# Patient Record
Sex: Female | Born: 1985
Health system: Southern US, Community
[De-identification: ages and names within clinical notes are randomized; demographics above are authoritative.]

## PROBLEM LIST (undated history)

## (undated) HISTORY — PX: TUBAL LIGATION: SHX77

---
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
## 2006-01-26 ENCOUNTER — Other Ambulatory Visit: Admission: RE | Admit: 2006-01-26 | Discharge: 2006-01-26 | Payer: Self-pay | Admitting: Internal Medicine

## 2007-08-21 ENCOUNTER — Encounter: Admission: RE | Admit: 2007-08-21 | Discharge: 2007-08-21 | Payer: Self-pay | Admitting: Internal Medicine

## 2008-01-10 ENCOUNTER — Other Ambulatory Visit: Admission: RE | Admit: 2008-01-10 | Discharge: 2008-01-10 | Payer: Self-pay | Admitting: Obstetrics and Gynecology

## 2009-02-01 ENCOUNTER — Encounter: Admission: RE | Admit: 2009-02-01 | Discharge: 2009-02-01 | Payer: Self-pay | Admitting: Orthopedic Surgery

## 2009-02-07 ENCOUNTER — Encounter: Admission: RE | Admit: 2009-02-07 | Discharge: 2009-02-07 | Payer: Self-pay | Admitting: Orthopedic Surgery

## 2009-02-21 ENCOUNTER — Encounter: Admission: RE | Admit: 2009-02-21 | Discharge: 2009-02-21 | Payer: Self-pay | Admitting: Orthopedic Surgery

## 2009-03-07 ENCOUNTER — Encounter: Admission: RE | Admit: 2009-03-07 | Discharge: 2009-03-07 | Payer: Self-pay | Admitting: Orthopedic Surgery

## 2009-07-15 ENCOUNTER — Encounter: Admission: RE | Admit: 2009-07-15 | Discharge: 2009-07-15 | Payer: Self-pay | Admitting: Internal Medicine

## 2010-04-22 ENCOUNTER — Ambulatory Visit: Payer: Self-pay | Admitting: Obstetrics & Gynecology

## 2010-04-23 ENCOUNTER — Inpatient Hospital Stay: Payer: Self-pay | Admitting: Obstetrics & Gynecology

## 2010-08-07 NOTE — Discharge Summary (Signed)
[name] [name], [name] [name]                           ACCOUNT NO.:  [id number]   MEDICAL RECORD NO.:  [id number]                   PATIENT TYPE:  INP   LOCATION:  9325                                 FACILITY:  [facility]   PHYSICIAN:  Randye Lobo, M.D.                DATE OF BIRTH:  1985/08/27   DATE OF ADMISSION:  03/04/2002  DATE OF DISCHARGE:  03/08/2002                                 DISCHARGE SUMMARY   FINAL DIAGNOSES:  1. Intrauterine pregnancy at term.  2. Chorioamnionitis.  3. Fetal tachycardia.  4. Nonreassuring fetal heart tones.   PROCEDURE:  Primary low transverse cesarean section.   SURGEON:  Malva Limes, M.D.   COMPLICATIONS:  None.   HOSPITAL COURSE:  This 25 year old G1 P0 presents at [redacted] weeks gestation in  active labor.  The patient's antepartum course had been complicated by late  prenatal care; she was not seen in our office until about 24-[redacted] weeks  gestation.  Otherwise, the patient's antepartum course had been  uncomplicated.  She did have a negative group B strep culture performed in  the office.  She was admitted at this time and Pitocin was started for labor  augmentation.  The patient's cervix was about 3 cm, 80% effaced, at a -3  station upon admission.  The patient had a very protracted labor curve but  continued to have change.  IUPCs were placed.  Later in the day on March 06, 2003 Dr. Dareen Piano was called.  The patient was noted to have deep  variable decelerations and fetal tachycardia and possible chorioamnionitis.  The patient's cervix at this time was completely dilated, rim present, and 0  station.  At this point a vaginal delivery was not eminent and she needed to  proceed with a cesarean section.  She was given Tylenol and subcu  terbutaline at that time and was taken to the operating room by Dr. Malva Limes where a primary low transverse cesarean section was performed with  the delivery of a  7 pound 10 ounce female infant with Apgars of  8 and 9.  The delivery went  without complications.  The patient's postoperative course was benign with  initial low-grade fevers.  The patient was felt ready for discharge on  postoperative day #3.   DISPOSITION:  1. Sent home on a regular diet.  2. Told to decrease activities.  3. Told to continue prenatal vitamins and iron sulfate.  4. Given a prescription for Tylox one to two q.4h. as needed for pain.  5. Told to follow up in the office in four weeks.   NOTE:  The baby did have bilateral dislocated hips.    LABORATORY DATA:  The patient's postoperative labs included a hemoglobin of  8.4 and a white blood cell count of 15.0.     Leilani Able, P.A.-C.  Randye Lobo, M.D.    MB/MEDQ  D:  05/15/2002  T:  05/15/2002  Job:  [id number]

## 2010-08-07 NOTE — Op Note (Signed)
[name], [name] [name]                           ACCOUNT NO.:  [id number]   MEDICAL RECORD NO.:  [id number]                   PATIENT TYPE:  INP   LOCATION:  9325                                 FACILITY:  [facility]   PHYSICIAN:  Malva Limes, M.D.                 DATE OF BIRTH:  1985/08/18   DATE OF PROCEDURE:  03/06/2002  DATE OF DISCHARGE:                                 OPERATIVE REPORT   PREOPERATIVE DIAGNOSES:  1. Intrauterine pregnancy at term.  2. Chorioamnionitis.  3. Fetal tachycardia.  4. Nonreassuring fetal heart tones.   POSTOPERATIVE DIAGNOSES:  1. Intrauterine pregnancy at term.  2. Chorioamnionitis.  3. Fetal tachycardia.  4. Nonreassuring fetal heart tones.   PROCEDURE:  Primary low transverse cesarean section.   SURGEON:  Malva Limes, M.D.   ANESTHESIA:  Epidural.   ANTIBIOTICS:  Cefotan 1 g.   ESTIMATED BLOOD LOSS:  900 cc.   COMPLICATIONS:  None.   SPECIMENS:  Placenta to pathology.   FINDINGS:  The patient had normal-appearing fallopian tubes and ovaries  bilaterally.  The uterus appeared to be normal.  The patient delivered one  live viable white female infant with Apgars of 8 at one minute and 9 at five  minutes, weighing 7 pounds 10 ounces.   DESCRIPTION OF PROCEDURE:  The patient was taken to the operating room,  where her epidural anesthetic was reinjected.  Once an adequate level was  reached, the patient was prepped with Betadine and draped in the usual  fashion for this procedure.  A Pfannenstiel incision was made.  This was  carried down to fascia.  The fascia was entered in the midline and extended  laterally with a Mayo scissors.  The rectus muscles were separated from the  fascia with the Bovie.  The rectus muscles were divided in the midline and  taken superiorly and inferiorly.  The parietal peritoneum was entered  sharply and taken superiorly and inferiorly, and the bladder flap was taken  down sharply.  A low transverse uterine  incision was made in the midline and  extended laterally with blunt dissection.  Amniotic fluid was noted to be  clear on entering the uterine cavity.  The infant was delivered in the  vertex presentation.  On delivery of the head, the oropharynx and nostrils  were bulb-suctioned.  The remaining infant was then delivered.  The cord was  clamped and cut and the infant handed to the waiting NICU team.  Cord blood  was then obtained.  The placenta was then manually removed and handed off to  be sent to pathology.  The uterus was exteriorized.  The uterine cavity was  wiped with a wet lap.  The uterine incision was closed in a single layer of  0 chromic in a running locking fashion.  The bladder flap was closed using 3-  0 chromic in a running fashion.  The uterus was placed back into the  abdominal cavity.  Hemostasis was checked and felt to be adequate.  The  abdominal gutters were wiped with a wet lap.  The parietal peritoneum and  rectus muscles were reapproximated in the midline using 3-0 chromic in a  running fashion.  The fascia was closed using 0 Monocryl in a running  fashion.  The subcuticular tissue was closed using interrupted 2-0 plain gut  suture.  The skin was closed using 4-0 Vicryl in a subcuticular fashion.  The patient tolerated the procedure well.  She was taken to the recovery  room in stable condition.  Instrument and lap counts were correct x2.                                               Malva Limes, M.D.    MA/MEDQ  D:  03/06/2002  T:  03/06/2002  Job:  [id number]

## 2012-04-04 IMAGING — US US TRANSVAGINAL NON-OB
1 series · 14 of 25 positions shown · non-contrast
Comparison: None.

CLINICAL DATA: menorrhagia

TRANSABDOMINAL AND TRANSVAGINAL ULTRASOUND OF PELVIS
TECHNIQUE: Both transabdominal and transvaginal ultrasound
examinations of the pelvis were performed including evaluation of
the uterus, ovaries, adnexal regions, and pelvic cul-de-sac.

[Series 1: us transvaginal non-ob · 0.28mm/px · 14 of 60 slices shown]
[im 1/60]
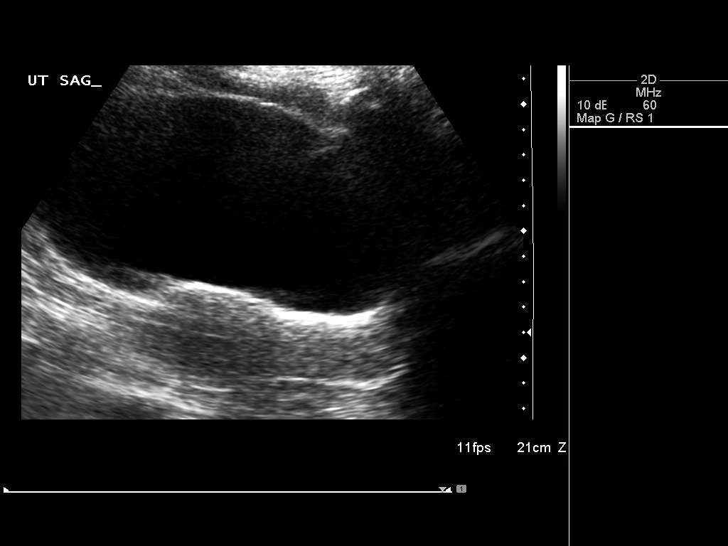
[im 5/60]
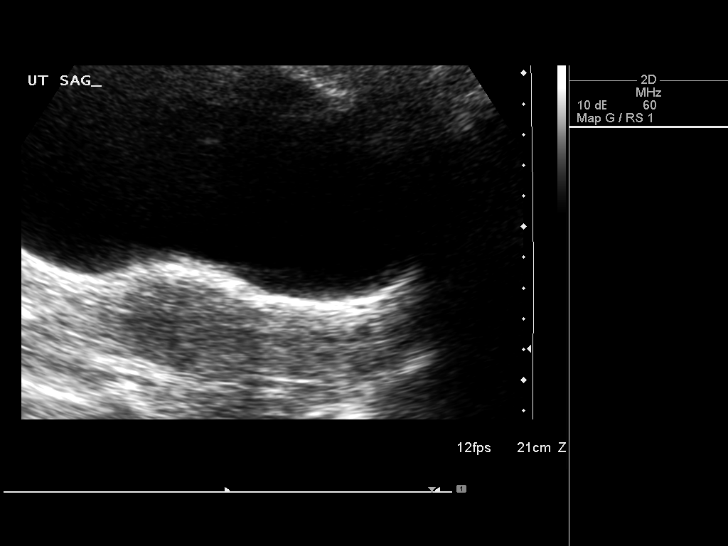
[im 10/60]
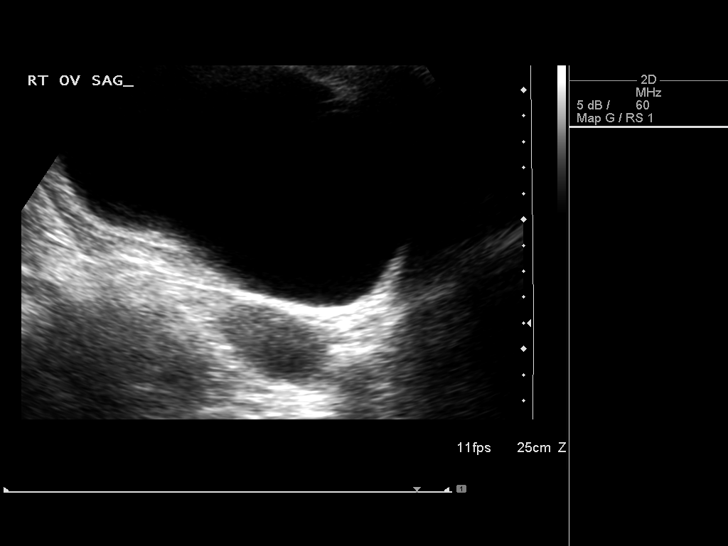
[im 15/60]
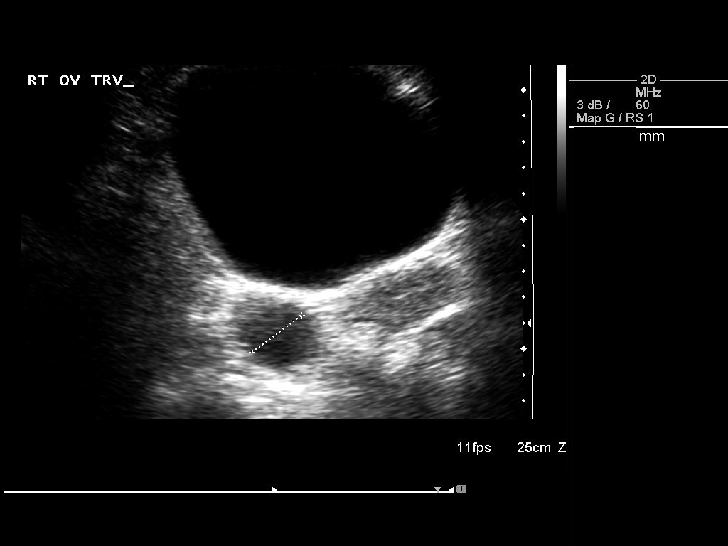
[im 20/60]
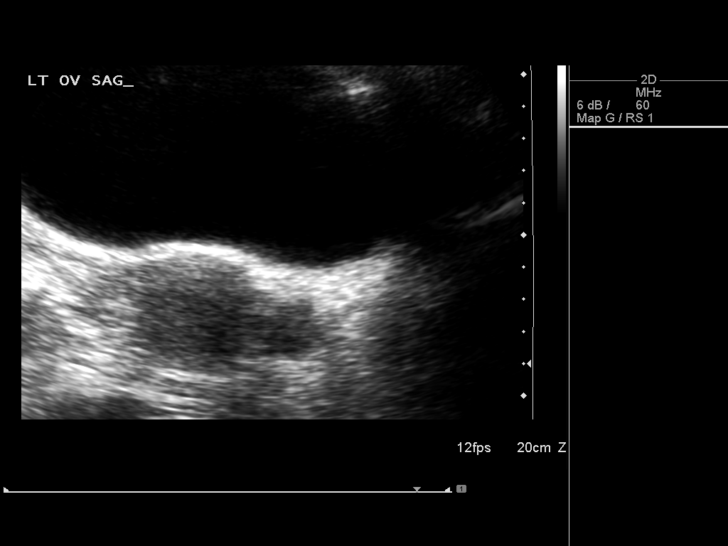
[im 23/60]
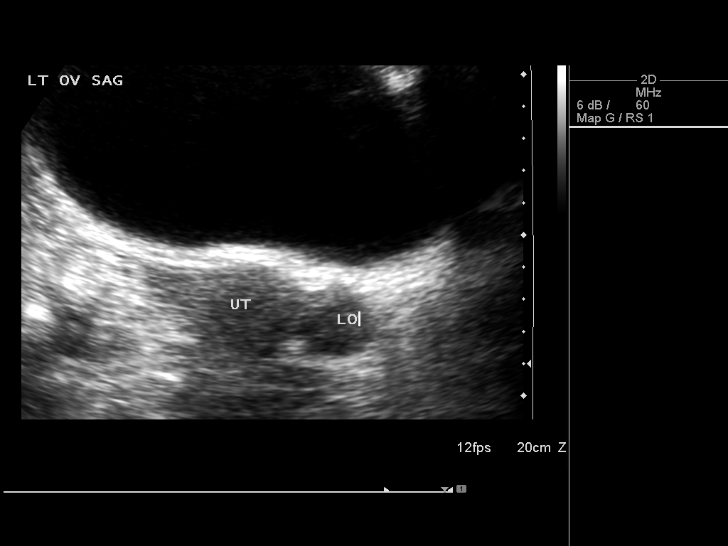
[im 28/60]
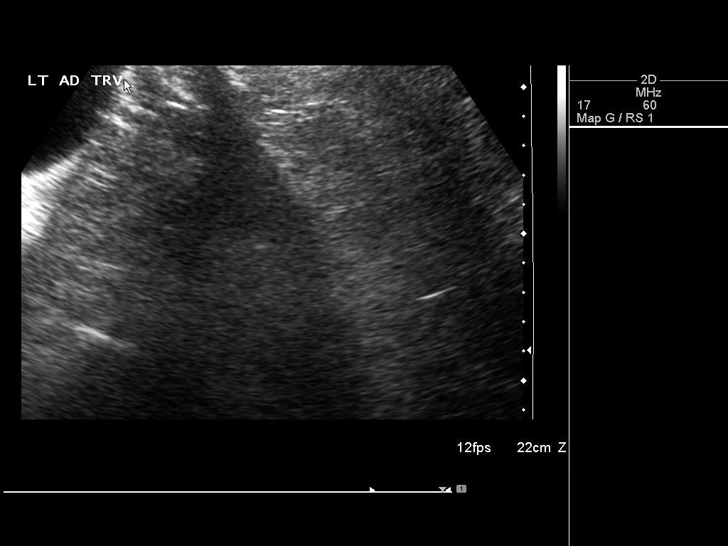
[im 32/60]
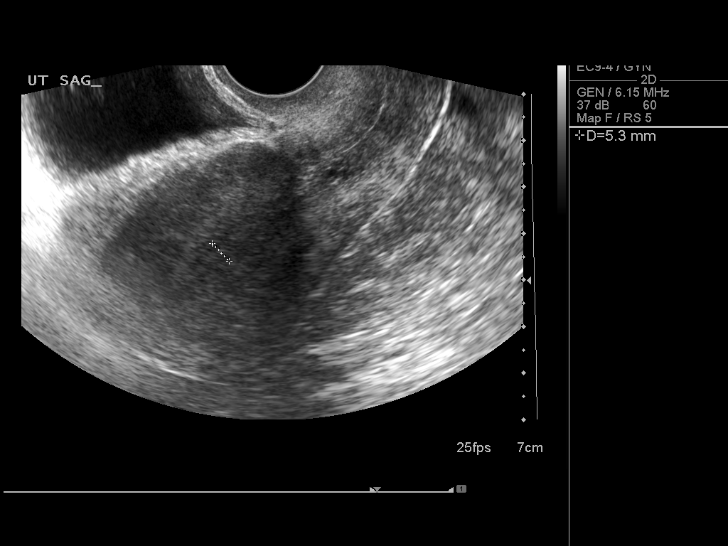
[im 37/60]
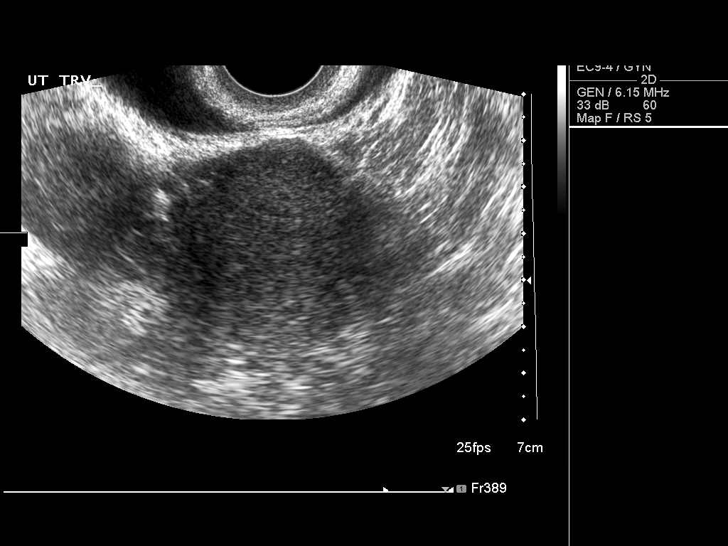
[im 40/60]
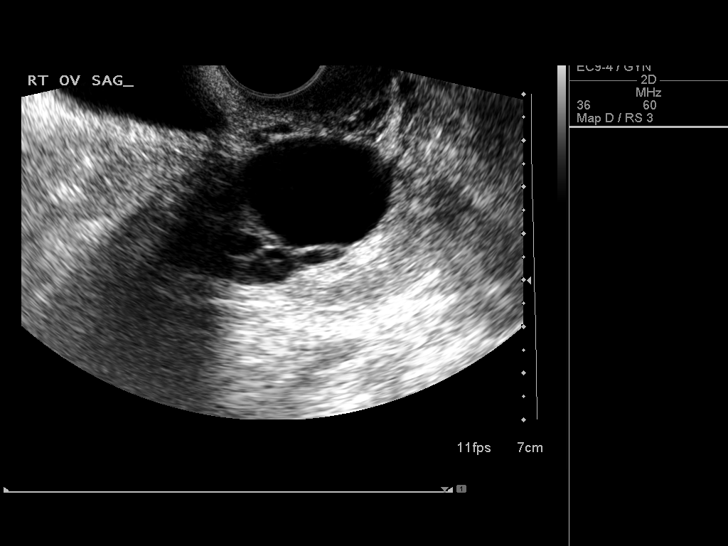
[im 45/60]
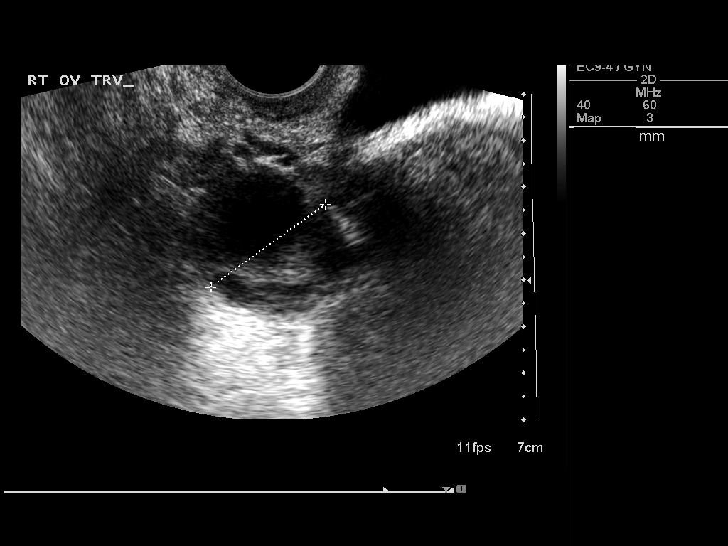
[im 50/60]
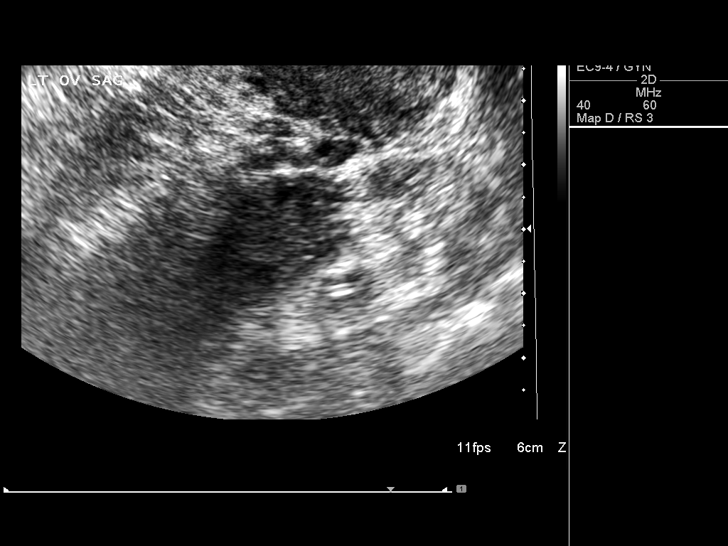
[im 55/60]
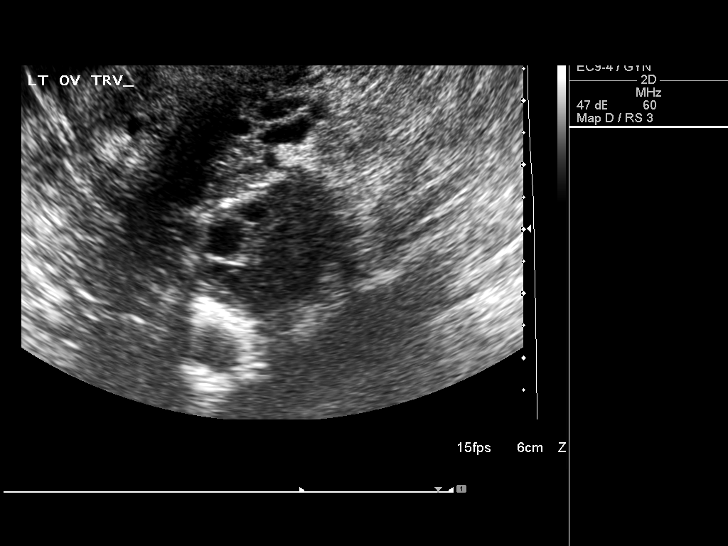
[im 60/60]
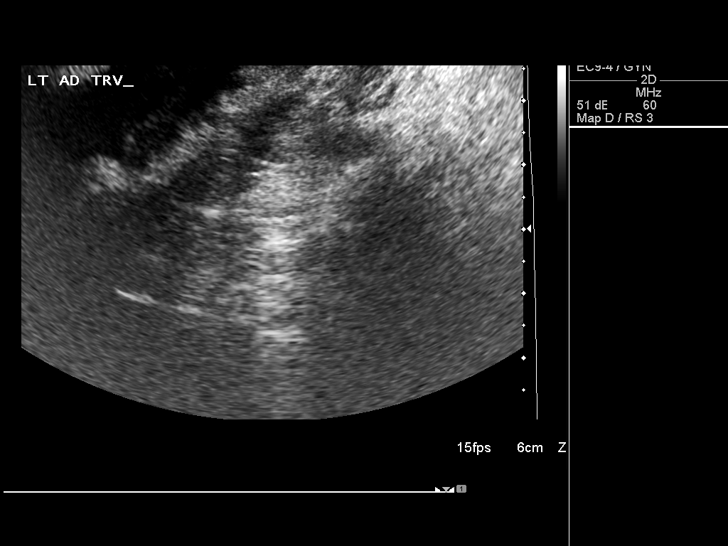

[14 of 25 positions shown; findings below may reference images not displayed]

FINDINGS: The uterus is normal in size and echotexture.  The
uterine dimensions are 7.8 x 4.0 x 3.7 cm.  The endometrial stripe
is thin and homogeneous, measuring 5 mm in width.

 Both ovaries are normal in size and appearance.  The right ovary
measures 4.9 x 2.9 x 3.0 cm and contains a 3 cm simple cyst which
is within normal limits for patient this age.  The left ovary
measures 2.8 x 1.7 x 1.9 cm.  There are no adnexal masses or free
pelvic fluid.
IMPRESSION: Normal pelvic ultrasound.

## 2013-06-21 ENCOUNTER — Observation Stay: Payer: Self-pay

## 2013-06-21 LAB — PROTEIN / CREATININE RATIO, URINE
Creatinine, Urine: 67.5 mg/dL (ref 30.0–125.0)
PROTEIN/CREAT. RATIO: 74 mg/g{creat} (ref 0–200)
Protein, Random Urine: 5 mg/dL (ref 0–12)

## 2013-06-21 LAB — PIH PROFILE
AST: 13 U/L — AB (ref 15–37)
Anion Gap: 7 (ref 7–16)
BUN: 5 mg/dL — AB (ref 7–18)
CO2: 26 mmol/L (ref 21–32)
Calcium, Total: 8.4 mg/dL — ABNORMAL LOW (ref 8.5–10.1)
Chloride: 105 mmol/L (ref 98–107)
Creatinine: 0.43 mg/dL — ABNORMAL LOW (ref 0.60–1.30)
EGFR (African American): >60
EGFR (Non-African Amer.): >60
GLUCOSE: 73 mg/dL (ref 65–99)
HCT: 33.9 % — ABNORMAL LOW (ref 35.0–47.0)
HGB: 11.3 g/dL — ABNORMAL LOW (ref 12.0–16.0)
MCH: 29.7 pg (ref 26.0–34.0)
MCHC: 33.3 g/dL (ref 32.0–36.0)
MCV: 89 fL (ref 80–100)
OSMOLALITY: 272 (ref 275–301)
PLATELETS: 225 10*3/uL (ref 150–440)
Potassium: 3.3 mmol/L — ABNORMAL LOW (ref 3.5–5.1)
RBC: 3.79 10*6/uL — AB (ref 3.80–5.20)
RDW: 13.6 % (ref 11.5–14.5)
Sodium: 138 mmol/L (ref 136–145)
Uric Acid: 3.7 mg/dL (ref 2.6–6.0)
WBC: 11.6 10*3/uL — AB (ref 3.6–11.0)

## 2013-08-20 ENCOUNTER — Ambulatory Visit: Payer: Self-pay | Admitting: Obstetrics and Gynecology

## 2013-08-20 LAB — CBC WITH DIFFERENTIAL/PLATELET
BASOS PCT: 0.3 %
Basophil #: 0 10*3/uL (ref 0.0–0.1)
EOS ABS: 0.1 10*3/uL (ref 0.0–0.7)
EOS PCT: 0.5 %
HCT: 34.4 % — AB (ref 35.0–47.0)
HGB: 11.3 g/dL — ABNORMAL LOW (ref 12.0–16.0)
LYMPHS ABS: 1.6 10*3/uL (ref 1.0–3.6)
LYMPHS PCT: 14 %
MCH: 29.4 pg (ref 26.0–34.0)
MCHC: 32.8 g/dL (ref 32.0–36.0)
MCV: 89 fL (ref 80–100)
MONOS PCT: 9.3 %
Monocyte #: 1.1 x10 3/mm — ABNORMAL HIGH (ref 0.2–0.9)
NEUTROS PCT: 75.9 %
Neutrophil #: 8.7 10*3/uL — ABNORMAL HIGH (ref 1.4–6.5)
PLATELETS: 234 10*3/uL (ref 150–440)
RBC: 3.84 10*6/uL (ref 3.80–5.20)
RDW: 14.4 % (ref 11.5–14.5)
WBC: 11.5 10*3/uL — ABNORMAL HIGH (ref 3.6–11.0)

## 2013-08-21 ENCOUNTER — Inpatient Hospital Stay: Payer: Self-pay

## 2013-08-22 LAB — HEMATOCRIT: HCT: 32 % — AB (ref 35.0–47.0)

## 2013-08-23 LAB — PATHOLOGY REPORT

## 2013-08-28 ENCOUNTER — Ambulatory Visit: Payer: Self-pay

## 2013-11-01 ENCOUNTER — Ambulatory Visit (INDEPENDENT_AMBULATORY_CARE_PROVIDER_SITE_OTHER): Payer: 59 | Admitting: Podiatry

## 2013-11-01 ENCOUNTER — Ambulatory Visit (INDEPENDENT_AMBULATORY_CARE_PROVIDER_SITE_OTHER): Payer: 59

## 2013-11-01 ENCOUNTER — Encounter: Payer: Self-pay | Admitting: Podiatry

## 2013-11-01 VITALS — BP 132/79 | HR 85 | Resp 16 | Ht 64.0 in | Wt 227.0 lb

## 2013-11-01 DIAGNOSIS — M722 Plantar fascial fibromatosis: Secondary | ICD-10-CM

## 2013-11-01 MED ORDER — TRIAMCINOLONE ACETONIDE 10 MG/ML IJ SUSP
10.0000 mg | Freq: Once | INTRAMUSCULAR | Status: AC
  Administered 2013-11-01: 10 mg

## 2013-11-01 NOTE — Progress Notes (Signed)
   Subjective:    Patient ID: [name] [name], female    DOB: 07-Sep-1985, 28 y.o.   MRN: [id number]  HPI Comments: "I have pain in this foot"  Patient c/o aching arch and medial foot right since early July. The area is swollen, red and palpable nodules. She tried soaking-some help. Walking a lot makes worse.  Foot Pain      Review of Systems  All other systems reviewed and are negative.      Objective:   Physical Exam        Assessment & Plan:

## 2013-11-01 NOTE — Progress Notes (Signed)
Subjective:     Patient ID: [name] [name], female   DOB: 11-Nov-1985, 28 y.o.   MRN: [id number]  Foot Pain   patient presents with pain in the arch of the right foot stating that she had baby 2 months ago and it's been hurting since then and she does not remember any injury   Review of Systems  All other systems reviewed and are negative.      Objective:   Physical Exam  Nursing note and vitals reviewed. Constitutional: She is oriented to person, place, and time.  Cardiovascular: Intact distal pulses.   Musculoskeletal: Normal range of motion.  Neurological: She is oriented to person, place, and time.  Skin: Skin is warm.   neurovascular status intact with muscle strength adequate and range of motion of the subtalar and midtarsal joint within normal limits. Patient is found to have pain in the right arch distal on the medial side with inflammation and no indication of a bite that occurred based on evaluation. Digits are well-perfused and arch height is mildly diminished     Assessment:     Probable medial band fasciitis versus any kind of bite or other systemic problem    Plan:     H&P and x-ray reviewed and injected carefully around the medial band 3 mg Kenalog 5 mg I can Marcaine mixture and advised on anti-inflammatories reduced activity and to reappoint if symptoms persist

## 2013-11-05 ENCOUNTER — Ambulatory Visit: Payer: Self-pay | Admitting: Podiatry

## 2014-07-13 NOTE — Op Note (Signed)
PATIENT NAME:  [name], [name] MR#:  [id number] DATE OF BIRTH:  1985/11/20  DATE OF PROCEDURE:  08/21/2013  PREOPERATIVE DIAGNOSES:  1. History of two prior low transverse cesarean section.  2. Desires permanent sterility.  3. Intrauterine pregnancy at 39 weeks, 1 day.   POSTOPERATIVE DIAGNOSES:  1. History of two prior low transverse cesarean section.  2. Desires permanent sterility.  3. Intrauterine pregnancy at 39 weeks, 1 day.   PROCEDURE PERFORMED: Repeat low transverse cesarean section via Pfannenstiel skin incision and bilateral tubal ligation via Pomeroy method.   ANESTHESIA USED: Spinal.   PRIMARY SURGEON: Florina Ou. Bonney Aid, M.D.   ASSISTANT: Dr. Annamarie Major.   ESTIMATED BLOOD LOSS: 800 mL   OPERATIVE FLUIDS: 1300 mL of crystalloid.   URINE OUTPUT: 200 mL of crystalloid.   PREOPERATIVE ANTIBIOTICS: 3 grams Ancef.   DRAINS OR TUBES: Foley to gravity drainage, On-Q catheter system.   IMPLANTS: None.   COMPLICATIONS: None.   SPECIMENS REMOVED: Portion of right and left tube.   INTRAOPERATIVE FINDINGS: Normal tubes, ovaries, and uterus. No adhesive disease noted within the abdomen. There was minimal scarring of the rectus fascia. Delivery resulted in the birth of a liveborn female infant weighing 3670 grams, 8 pounds 1 ounce, Apgars 8 and 9.   THE PATIENT CONDITION FOLLOWING PROCEDURE: Stable.   PROCEDURE IN DETAIL: Risks, benefits, and alternatives of the procedure were discussed with the patient prior to proceeding to the Operating Room. The patient was taken to the Operating Room where she was placed under spinal anesthesia. The patient was positioned in the supine position, prepped and draped in the usual fashion. Prior to proceeding with the case a timeout procedure was performed and the level anesthetic was checked and noted to be adequate. A Pfannenstiel skin incision was made 2 cm above the pubic symphysis utilizing the patient's pre-existing scar. This was  carried down sharply to the level of the rectus fascia using the scalpel. The fascia was incised in the midline with the scalpel before being extended using Mayo scissors. The superior border of the rectus fascia was then grasped with two Coker clamps. The underlying rectus muscles were dissected off the rectus fascia bluntly. The median raphe incised using Mayo scissors. The inferior border of the rectus fascia was dissected off the rectus muscle in a similar fashion. The midline was identified and there was a small defect in the peritoneum already, which was grasped and extended to allow entry into the peritoneal cavity. A bladder blade was then placed. A bladder flap was created using Metzenbaum scissors further developed digitally before replacing the bladder blade and displacing the bladder caudally. A low transverse uterine incision was then scored on the uterus. The uterus was entered bluntly using the operator's finger. The hysterotomy incision was extended using manual traction. Placing the operator's hand into the hysterotomy incision, the infant was noted to be in OA position. The vertex was grasped, flexed, brought to the incision, delivered atraumatically using fundal pressure. The remainder of the body delivered with ease. The infant was suctioned, cord was clamped and cut and the infant was passed to the awaiting pediatricians. Placenta was delivered using manual extraction. The uterus was exteriorized and wiped clean of clots and debris and closed in a single layer closure of 0 Vicryl in a running locked fashion. Attention was then turned to the patient's right fallopian tube. This was grasped with a Babcock clamp in the mid isthmic portion and doubly suture ligated using a 0  Vicryl wheel. The intervening knuckle of tube was then excised using Metzenbaum scissors. This was also repeated on the patient's left tube in a similar fashion. Both excisions revealed complete cross sections of tube and the  tubal knuckles were noted to be hemostatic. The uterus was returned to the abdomen, and in the process of returning the uterus to the abdomen and reinspecting the tubes, the proximal portion of the right tubal knuckle was noted to have slipped out of the sutures. This was oversewn using a 2-0 Vicryl across the tubal knuckle and a running locked stitch of 2-0 Vicryl across the mesosalpinx. The tube was noted to be hemostatic. Following this application of suture, the left tube was noted to be well within the knuckle of tube and hemostatic. The hysterotomy incision was reinspected and noted to be hemostatic as well. The peritoneal gutters were wiped clean of clots and debris. The rectus muscles were reapproximated in midline using a 2-0 Vicryl mattress stitch. The On-Q catheters were then placed subfascially per the usual protocol. The fascia was closed using a single looped #1 PDS in a running fashion. The subcutaneous tissue was irrigated. Hemostasis was achieved using the Bovie, a 53 plain gut needle was then used to do a running suture to reapproximate the subcutaneous dead space. Skin was closed using a 4-0 Monocryl in a subcuticular fashion. The incision was then dressed with Dermabond as were the On-Q catheters, which were bolused with 5 mL of 0.5% bupivacaine each. Sponge, needle and instrument counts were correct x 2. The patient tolerated the procedure well and was taken to the recovery room in stable condition.    ____________________________ Florina OuAndreas M. Bonney AidStaebler, MD ams:sg D: 08/22/2013 08:46:42 ET T: 08/22/2013 09:24:16 ET JOB#: [id number]  cc: Florina OuAndreas M. Bonney AidStaebler, MD, <Dictator> Carmel SacramentoANDREAS Cathrine MusterM Ashleigh Luckow MD ELECTRONICALLY SIGNED 08/30/2013 8:30

## 2014-07-13 NOTE — Consult Note (Signed)
Details:   - Established Intermediate visit charge. 08/28/2013- Baby is [name] [name]. Born [birth year] at 8lb 1 oz at birth ; 7lb 11oz yesterday at MD office: 7lb 13oz nude today. Mom c/o poor latch after milk "came in" around day 4-5 of life. Ended up pumping and bottle feeding a little and for the last 24 hours , has been using 24 mm nipple shield exclusively. >6 wets adn 5 stools last 24 hours. Mildly jaundiced in face only (bili level done yesterday and is at 11. No orders from MD other than keep breastfeeding). Nipples are large but intact. Breasts are quite full, but not engorged. She brought in a 7oz bottle of pumped milk that was from one session and both breasts from earlier in the day. [name] [name] has her 633 yo daughter and her supportive mother with her. [name] [name] is calm and eager to have breastfeeding go well.  Feeding: Mom was not going to use pillow support (potential cause of latch issue #1); had [name] [name] placed on her back with head turned toward breast (#2); Had her own fingers too close to nipple for proper latch (#3); and [name] [name] was used to fast flow of bottle and shape of nipple shield (#4+5). We addressed all these issues. [name] [name] still fussed at breast at first wihtout using nipple shield, so I had [name] [name] pump to start flow and soften areola a bit more. [name] [name] still fussed, so Mom applied nipple shield to get her started. She needed to massage breast to help with th eflow, once [name] [name] got the hang of it, she had rhythmic suck/swallow with a few gulps which she handles well by taking a pause before resuming. After a couple minutes of that, I had Mom remove the shield an dtry to latch [name] [name] on (I think reclined hold will be best for them; it was in hospital, but I could not fit recliner in my office. Mom to try at home). [name] [name] soon was doing same suck swallow pattern as with the shield. I stressed importance and differeance of nursing without shield as she is quite able. She took in 61 ml in approx 15  minutes.   - I gave report [place] [phone]  PLAN:* feed per cues at least 8-12 /24 hours to soften one breast quite well (She is making over 4 oz per breast right now) *Pre-pump/hand express to soften areola only if it is too firm for proper latch or to start flow of milk *Careful position/latch technique and hand placement on breast- try reclined hold *Lots of skin to skin *Call if more help needed. [facility] or [facility] (ph #s given)   Electronic Signatures: [name] [name], [name] [name] (RN, BSN, [employer]CLC)  (Signed 09-Jun-15 16:15)  Authored: Details   Last Updated: 09-Jun-15 16:15 by [name] [name], [name] [name] (RN, BSN, IBCLC)

## 2014-07-30 NOTE — H&P (Signed)
L&D Evaluation:  History:  HPI Pt is a 29 yo G3P2 at 30.[redacted] weeks GA with an EDC of 08/27/13 who presents to L&D after being sent over from the office with elevated BP. She also had a growth scan today which was normal. She reports + FM, denies vb, lof or ctx. She denies visual disturbances, RUQ pain, or neurological symptoms. Her prenatal course is significant for obesity, and elevated BP at 17 weeks. A baseline PIH panel was done and was normal. Baseline 24 hour urine was 120mg . Prenatal labs: A+, RI, VI, and TDaP and flu vaccine in this pregnancy.   Presents with other, PIH work up   Patient's Medical History No Chronic Illness   Patient's Surgical History Previous C-Section  c/s x2   Medications Pre [place]atal Vitamins   Allergies NKDA   Social History none   Family History Non-Contributory   ROS:  ROS All systems were reviewed.  HEENT, CNS, GI, GU, Respiratory, CV, Renal and Musculoskeletal systems were found to be normal.   Exam:  Vital Signs stable   General no apparent distress   Mental Status clear   Abdomen gravid, non-tender   Mebranes Intact   FHT normal rate with no decels, 140-150's.   Ucx absent   Skin dry, no lesions, no rashes   Lymph no lymphadenopathy   Impression:  Impression evaluation for PIH   Plan:  Plan PIH panel, plan of care to be determined by results of PIH panel   Follow Up Appointment already scheduled   Electronic Signatures for Addendum Section:  Conard NovakJackson, Stephen D (MD) (Signed Addendum 02-Apr-15 14:35)  Patient's HELLP and preeclampsia labs have returned as normal.  Spot Urine prot/creatinie ratio = 74. BPs 130/74, 125/76, 119/72, 123/68, 127/69, 124/67 NST reassuring given gestational age with multiple 10x10 accels in a 20 minute period. Patient still without features of severe preeclampsia (no headache visual changes, and ruq pain). Will discharge home with plan for follow up in 1 week.  Strict precautions given (headache, visual  changes, ruq pain, decrease in fetal movement, contractions, vaginal bleeding. leakage of fluid, other concerning vague symptoms).  She voices understanding.   Electronic Signatures: Jannet MantisSubudhi, Kristian Hazzard (CNM)  (Signed 02-Apr-15 12:36)  Authored: L&D Evaluation   Last Updated: 02-Apr-15 14:35 by Conard NovakJackson, Stephen D (MD)

## 2016-01-22 ENCOUNTER — Ambulatory Visit (INDEPENDENT_AMBULATORY_CARE_PROVIDER_SITE_OTHER): Payer: 59 | Admitting: Family Medicine

## 2016-01-22 ENCOUNTER — Encounter: Payer: Self-pay | Admitting: Family Medicine

## 2016-01-22 DIAGNOSIS — D509 Iron deficiency anemia, unspecified: Secondary | ICD-10-CM | POA: Insufficient documentation

## 2016-01-22 DIAGNOSIS — J3089 Other allergic rhinitis: Secondary | ICD-10-CM

## 2016-01-22 DIAGNOSIS — F418 Other specified anxiety disorders: Secondary | ICD-10-CM | POA: Insufficient documentation

## 2016-01-22 DIAGNOSIS — Z6836 Body mass index (BMI) 36.0-36.9, adult: Secondary | ICD-10-CM

## 2016-01-22 DIAGNOSIS — D508 Other iron deficiency anemias: Secondary | ICD-10-CM | POA: Diagnosis not present

## 2016-01-22 DIAGNOSIS — Z8639 Personal history of other endocrine, nutritional and metabolic disease: Secondary | ICD-10-CM | POA: Insufficient documentation

## 2016-01-22 DIAGNOSIS — F4323 Adjustment disorder with mixed anxiety and depressed mood: Secondary | ICD-10-CM | POA: Diagnosis not present

## 2016-01-22 LAB — CBC WITH DIFFERENTIAL/PLATELET
BASOS PCT: 0 %
Basophils Absolute: 0 cells/uL (ref 0–200)
EOS ABS: 65 {cells}/uL (ref 15–500)
Eosinophils Relative: 1 %
HEMATOCRIT: 36.9 % (ref 35.0–45.0)
HEMOGLOBIN: 12 g/dL (ref 11.7–15.5)
LYMPHS ABS: 2275 {cells}/uL (ref 850–3900)
Lymphocytes Relative: 35 %
MCH: 28.9 pg (ref 27.0–33.0)
MCHC: 32.5 g/dL (ref 32.0–36.0)
MCV: 88.9 fL (ref 80.0–100.0)
MONO ABS: 585 {cells}/uL (ref 200–950)
MPV: 9.7 fL (ref 7.5–12.5)
Monocytes Relative: 9 %
NEUTROS ABS: 3575 {cells}/uL (ref 1500–7800)
Neutrophils Relative %: 55 %
Platelets: 309 10*3/uL (ref 140–400)
RBC: 4.15 MIL/uL (ref 3.80–5.10)
RDW: 14.6 % (ref 11.0–15.0)
WBC: 6.5 10*3/uL (ref 3.8–10.8)

## 2016-01-22 MED ORDER — ESCITALOPRAM OXALATE 10 MG PO TABS: 10.0000 mg | ORAL_TABLET | Freq: Every day | ORAL | 1 refills | Status: DC

## 2016-01-22 NOTE — Assessment & Plan Note (Signed)
Per patient name years ago- was told by her GYN. -We'll obtain fasting lipid profile today.

## 2016-01-22 NOTE — Assessment & Plan Note (Signed)
Briefly discussed possibility of going on a beta blocker with patient prior to being put in a stressful situation.

## 2016-01-22 NOTE — Assessment & Plan Note (Signed)

## 2016-01-22 NOTE — Assessment & Plan Note (Signed)
Takes a multivitamin daily with extra iron  - We will check CBC

## 2016-01-22 NOTE — Progress Notes (Signed)
New patient office visit note:  Impression and Recommendations:    1. Other iron deficiency anemia   2. Adjustment disorder with mixed anxiety and depressed mood   3. Environmental and seasonal allergies   4. Situational anxiety   5. History of hyperlipidemia   6. BMI 36.0-36.9,adult    Iron deficiency anemia Takes a multivitamin daily with extra iron  - We will check CBC  Adjustment disorder with mixed anxiety >er depressed mood Been on Lexapro for years now. Stable. Refill given.  - Xanax only occasionally as needed- once or twice per month is ok  Environmental and seasonal allergies -Claritin daily -Over-the-counter Flonase or Rhinocort when necessary -Sinus rinses twice a day  Situational anxiety Briefly discussed possibility of going on a beta blocker with patient prior to being put in a stressful situation.  History of hyperlipidemia Per patient name years ago- was told by her GYN. -We'll obtain fasting lipid profile today.  BMI 36.0-36.9,adult Explained to patient what BMI refers to, and what it means medically.    Told patient to think about it as a "medical risk stratification measurement" and how increasing BMI is associated with increasing risk/ or worsening state of various diseases such as hypertension, hyperlipidemia, diabetes, premature OA, depression etc.  American Heart Association guidelines for healthy diet, basically Mediterranean diet, and exercise guidelines of 30 minutes 5 days per week or more discussed in detail.  Health counseling performed.  All questions answered.     Orders Placed This Encounter  Procedures  . CBC with Differential/Platelet  . COMPLETE METABOLIC PANEL WITH GFR  . Hemoglobin A1c  . Lipid Panel w/reflex Direct LDL  . T4, free  . TSH  . VITAMIN D 25 Hydroxy (Vit-D Deficiency, Fractures)    New Prescriptions   No medications on file    Modified Medications   Modified Medication Previous Medication   ESCITALOPRAM (LEXAPRO) 10 MG TABLET escitalopram (LEXAPRO) 10 MG tablet      Take 1 tablet (10 mg total) by mouth daily.    Take 10 mg by mouth daily.    Discontinued Medications   DOCOSAHEXAENOIC ACID (DHA PO)    Take by mouth.   IRON PO    Take by mouth.   PRENATAL VIT-FE FUMARATE-FA (PRENATAL VITAMIN PO)    Take by mouth.    Return for f/up to review FBW at your convenience and discuss wt loss.  The patient was counseled, risk factors were discussed, anticipatory guidance given.  Gross side effects, risk and benefits, and alternatives of medications discussed with patient.  Patient is aware that all medications have potential side effects and we are unable to predict every side effect or drug-drug interaction that may occur.  Expresses verbal understanding and consents to current therapy plan and treatment regimen.  Please see AVS handed out to patient at the end of our visit for further patient instructions/ counseling done pertaining to today's office visit.    Note: This document was prepared using Dragon voice recognition software and may include unintentional dictation errors.  ----------------------------------------------------------------------------------------------------------------------    Subjective:    Chief Complaint  Patient presents with  . Establish Care    HPI: [name] [name] is a pleasant 30 y.o. female who [facility] to review their medical history with me and establish care.   I asked the patient to review their chronic problem list with me to ensure everything was  updated and accurate.    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   Wt- w/in past yr-- was 34 in Jan-- was 236 prior.  "walk away the lbs DVD at home".   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      Patient Care Team    Relationship Specialty Notifications Start End  Thomasene Lot, DO PCP - General Family Medicine  01/22/16   Lunette Stands, MD Consulting Physician Orthopedic Surgery  01/22/16   Vena Austria, MD Referring Physician Obstetrics and Gynecology  01/22/16   Blima Ledger, OD  Optometry  01/22/16      Wt Readings from Last 3 Encounters:  01/22/16 215 lb 8 oz (97.8 kg)  11/01/13 227 lb (103 kg)   BP Readings from Last 3 Encounters:  01/22/16 135/85  11/01/13 132/79   Pulse Readings from Last 3 Encounters:  01/22/16 83  11/01/13 85   BMI Readings from Last 3 Encounters:  01/22/16 36.99 kg/m  11/01/13 38.96 kg/m   No results found for: HGBA1C  Patient Active Problem List   Diagnosis Date Noted  . Iron deficiency anemia 01/22/2016  . Adjustment disorder with mixed anxiety >er depressed mood 01/22/2016  . Environmental and seasonal allergies 01/22/2016  . Situational anxiety 01/22/2016  . History of hyperlipidemia 01/22/2016  . BMI 36.0-36.9,adult 01/22/2016     History reviewed. No pertinent past medical history.   Past Surgical History:  Procedure Laterality Date  . CESAREAN SECTION    . TUBAL LIGATION       Family History  Problem Relation Age of Onset  . Cancer Maternal Grandmother 56    ovarian  . Diabetes Maternal Grandmother 70     History  Drug Use No    History  Alcohol Use No    History  Smoking Status  . Never Smoker  Smokeless Tobacco  . Never Used    Patient's Medications  New Prescriptions   No medications on file  Previous Medications   CLONAZEPAM (KLONOPIN) 0.5 MG TABLET    Take 0.5 mg by mouth as needed for anxiety.   LORATADINE (CLARITIN) 10 MG TABLET    Take 10 mg by mouth daily.   MULTIPLE VITAMIN (MULTIVITAMIN) TABLET    Take 1 tablet by mouth daily.  Modified Medications   Modified Medication Previous Medication   ESCITALOPRAM (LEXAPRO) 10 MG TABLET  escitalopram (LEXAPRO) 10 MG tablet      Take 1 tablet (10 mg total) by mouth daily.    Take 10 mg by mouth daily.  Discontinued Medications   DOCOSAHEXAENOIC ACID (DHA PO)    Take by mouth.   IRON PO    Take by mouth.   PRENATAL VIT-FE FUMARATE-FA (PRENATAL VITAMIN PO)    Take by mouth.    Allergies: Review of patient's allergies indicates no known allergies.  Review of Systems  Constitutional: Negative.  Negative for chills, diaphoresis, fever, malaise/fatigue and weight loss.  HENT: Negative.  Negative for congestion, sore throat and tinnitus.   Eyes: Negative.  Negative for blurred vision, double vision and photophobia.  Respiratory: Negative.  Negative for cough and wheezing.   Cardiovascular: Negative.  Negative for chest pain and palpitations.  Gastrointestinal: Negative.  Negative for blood in stool, diarrhea, nausea and vomiting.  Genitourinary:  Negative.  Negative for dysuria, frequency and urgency.  Musculoskeletal: Negative.  Negative for joint pain and myalgias.  Skin: Negative.  Negative for itching and rash.  Neurological: Negative.  Negative for dizziness, focal weakness, weakness and headaches.  Endo/Heme/Allergies: Negative.  Negative for environmental allergies and polydipsia. Does not bruise/bleed easily.  Psychiatric/Behavioral: Negative.  Negative for depression and memory loss. The patient is not nervous/anxious and does not have insomnia.      Objective:   Blood pressure 135/85, pulse 83, height 5\' 4"  (1.626 m), weight 215 lb 8 oz (97.8 kg), last menstrual period 01/05/2016. Body mass index is 36.99 kg/m. General: Well Developed, well nourished, and in no acute distress.  Neuro: Alert and oriented x3, extra-ocular muscles intact, sensation grossly intact.  HEENT: Normocephalic, atraumatic, pupils equal round reactive to light, neck supple Skin: no gross suspicious lesions or rashes  Cardiac: Regular rate and rhythm, no murmurs rubs or gallops.  Respiratory:  Essentially clear to auscultation bilaterally. Not using accessory muscles, speaking in full sentences.  Abdominal: Soft, not grossly distended Musculoskeletal: Ambulates w/o diff, FROM * 4 ext.  Vasc: less 2 sec cap RF, warm and pink  Psych:  No HI/SI, judgement and insight good, Euthymic mood. Full Affect.

## 2016-01-22 NOTE — Patient Instructions (Addendum)
PLEASE NOTE:  The office will be contacting you if your labs or recent test results are not within acceptable normal values within one week of them being done.    Furthermore, you'll be able to review all of your results in "My Chart," when they become available; so please sign-up if you have not already done so.   Keep a copy of these results in your personal home file to share with your other physicians as warranted. If you have any further questions or concerns please do not hesitate to contact us.   Thank you and we appreciate you choosing us as your primary care provider.   - 'the Team' at [facility]      Please realize, EXERCISE IS MEDICINE!  -  American Heart Association [facility]( AHA) guidelines for exercise : If you are in good health, without any medical conditions, you should engage in 150 minutes of moderate intensity aerobic activity per week.  This means you should be huffing and puffing throughout your workout.   Engaging in regular exercise will improve brain function and memory, as well as improve mood, boost immune system and help with weight management.  As well as the other, more well-known effects of exercise such as decreasing blood sugar levels, decreasing blood pressure,  and decreasing bad cholesterol levels/ increasing good cholesterol levels.     -  The AHA strongly endorses consumption of a diet that contains a variety of foods from all the food categories with an emphasis on fruits and vegetables; fat-free and low-fat dairy products; cereal and grain products; legumes and nuts; and fish, poultry, and/or extra lean meats.    Excessive food intake, especially of foods high in saturated and trans fats, sugar, and salt, should be avoided.    Adequate water intake of roughly 1/2 of your weight in pounds, should equal the ounces of water per day you should drink.  So for instance, if you're 200 pounds, that would be 100 ounces of water per  day.         Mediterranean Diet  Why follow it? Research shows. . Those who follow the Mediterranean diet have a reduced risk of heart disease  . The diet is associated with a reduced incidence of Parkinson's and Alzheimer's diseases . People following the diet may have longer life expectancies and lower rates of chronic diseases  . The Dietary Guidelines for Americans recommends the Mediterranean diet as an eating plan to promote health and prevent disease  What Is the Mediterranean Diet?  . Healthy eating plan based on typical foods and recipes of Mediterranean-style cooking . The diet is primarily a plant based diet; these foods should make up a majority of meals   Starches - Plant based foods should make up a majority of meals - They are an important sources of vitamins, minerals, energy, antioxidants, and fiber - Choose whole grains, foods high in fiber and minimally processed items  - Typical grain sources include wheat, oats, barley, corn, brown rice, bulgar, farro, millet, polenta, couscous  - Various types of beans include chickpeas, lentils, fava beans, black beans, white beans   Fruits  Veggies - Large quantities of antioxidant rich fruits & veggies; 6 or more servings  - Vegetables can be eaten raw or lightly drizzled with oil and cooked  - Vegetables common to the traditional Mediterranean Diet include: artichokes, arugula, beets, broccoli, brussel sprouts, cabbage, carrots, celery, collard greens, cucumbers, eggplant, kale, leeks, lemons, lettuce, mushrooms, okra, onions,  peas, peppers, potatoes, pumpkin, radishes, rutabaga, shallots, spinach, sweet potatoes, turnips, zucchini - Fruits common to the Mediterranean Diet include: apples, apricots, avocados, cherries, clementines, dates, figs, grapefruits, grapes, melons, nectarines, oranges, peaches, pears, pomegranates, strawberries, tangerines  Fats - Replace butter and margarine with healthy oils, such as olive oil, canola  oil, and tahini  - Limit nuts to no more than a handful a day  - Nuts include walnuts, almonds, pecans, pistachios, pine nuts  - Limit or avoid candied, honey roasted or heavily salted nuts - Olives are central to the [employer]iterranean diet - can be eaten whole or used in a variety of dishes   Meats Protein - Limiting red meat: no more than a few times a month - When eating red meat: choose lean cuts and keep the portion to the size of deck of cards - Eggs: approx. 0 to 4 times a week  - Fish and lean poultry: at least 2 a week  - Healthy protein sources include, chicken, [place], lean beef, lamb - Increase intake of seafood such as tuna, salmon, trout, mackerel, shrimp, scallops - Avoid or limit high fat processed meats such as sausage and bacon  Dairy - Include moderate amounts of low fat dairy products  - Focus on healthy dairy such as fat free yogurt, skim milk, low or reduced fat cheese - Limit dairy products higher in fat such as whole or 2% milk, cheese, ice cream  Alcohol - Moderate amounts of red wine is ok  - No more than 5 oz daily for women (all ages) and men older than age 365  - No more than 10 oz of wine daily for men younger than 3265  Other - Limit sweets and other desserts  - Use herbs and spices instead of salt to flavor foods  - Herbs and spices common to the traditional Mediterranean Diet include: basil, bay leaves, chives, cloves, cumin, fennel, garlic, lavender, marjoram, mint, oregano, parsley, pepper, rosemary, sage, savory, sumac, tarragon, thyme   It's not just a diet, it's a lifestyle:  . The Mediterranean diet includes lifestyle factors typical of those in the region  . Foods, drinks and meals are best eaten with others and savored . Daily physical activity is important for overall good health . This could be strenuous exercise like running and aerobics . This could also be more leisurely activities such as walking, housework, yard-work, or taking the  stairs . Moderation is the key; a balanced and healthy diet accommodates most foods and drinks . Consider portion sizes and frequency of consumption of certain foods   Meal Ideas & Options:  . Breakfast:  o Whole wheat toast or whole wheat English muffins with peanut butter & hard boiled egg o Steel cut oats topped with apples & cinnamon and skim milk  o Fresh fruit: banana, strawberries, melon, berries, peaches  o Smoothies: strawberries, bananas, greek yogurt, peanut butter o Low fat greek yogurt with blueberries and granola  o Egg white omelet with spinach and mushrooms o Breakfast couscous: whole wheat couscous, apricots, skim milk, cranberries  . Sandwiches:  o Hummus and grilled vegetables (peppers, zucchini, squash) on whole wheat bread   o Grilled chicken on whole wheat pita with lettuce, tomatoes, cucumbers or tzatziki  o Tuna salad on whole wheat bread: tuna salad made with greek yogurt, olives, red peppers, capers, green onions o Garlic rosemary lamb pita: lamb sauted with garlic, rosemary, salt & pepper; add lettuce, cucumber, greek yogurt to [employer]ita - flavor  with lemon juice and black pepper  . Seafood:  o Mediterranean grilled salmon, seasoned with garlic, basil, parsley, lemon juice and black pepper o Shrimp, lemon, and spinach whole-grain pasta salad made with low fat greek yogurt  o Seared scallops with lemon orzo  o Seared tuna steaks seasoned salt, pepper, coriander topped with tomato mixture of olives, tomatoes, olive oil, minced garlic, parsley, green onions and cappers  . Meats:  o Herbed greek chicken salad with kalamata olives, cucumber, feta  o Red bell peppers stuffed with spinach, bulgur, lean ground beef (or lentils) & topped with feta   o Kebabs: skewers of chicken, tomatoes, onions, zucchini, squash  o [place] burgers: made with red onions, mint, dill, lemon juice, feta cheese topped with roasted red peppers . Vegetarian o Cucumber salad: cucumbers, artichoke  hearts, celery, red onion, feta cheese, tossed in olive oil & lemon juice  o Hummus and whole grain pita points with a greek salad (lettuce, tomato, feta, olives, cucumbers, red onion) o Lentil soup with celery, carrots made with vegetable broth, garlic, salt and pepper  o Tabouli salad: parsley, bulgur, mint, scallions, cucumbers, tomato, radishes, lemon juice, olive oil, salt and pepper.

## 2016-01-22 NOTE — Assessment & Plan Note (Addendum)
Been on Lexapro for years now. Stable. Refill given.  - Xanax only occasionally as needed- once or twice per month is ok

## 2016-01-22 NOTE — Assessment & Plan Note (Signed)
-  Claritin daily -Over-the-counter Flonase or Rhinocort when necessary -Sinus rinses twice a day

## 2016-01-23 LAB — COMPLETE METABOLIC PANEL WITH GFR
ALT: 14 U/L (ref 6–29)
AST: 15 U/L (ref 10–30)
Albumin: 4.3 g/dL (ref 3.6–5.1)
Alkaline Phosphatase: 51 U/L (ref 33–115)
BILIRUBIN TOTAL: 0.3 mg/dL (ref 0.2–1.2)
BUN: 9 mg/dL (ref 7–25)
CALCIUM: 9.5 mg/dL (ref 8.6–10.2)
CHLORIDE: 102 mmol/L (ref 98–110)
CO2: 28 mmol/L (ref 20–31)
CREATININE: 0.61 mg/dL (ref 0.50–1.10)
GFR, Est Non African American: >89 mL/min (ref >=60)
Glucose, Bld: 88 mg/dL (ref 65–99)
Potassium: 5.1 mmol/L (ref 3.5–5.3)
Sodium: 140 mmol/L (ref 135–146)
TOTAL PROTEIN: 7.4 g/dL (ref 6.1–8.1)

## 2016-01-23 LAB — HEMOGLOBIN A1C
HEMOGLOBIN A1C: 5.1 % (ref <5.7)
MEAN PLASMA GLUCOSE: 100 mg/dL

## 2016-01-23 LAB — LIPID PANEL W/REFLEX DIRECT LDL
CHOL/HDL RATIO: 6.4 ratio — AB (ref <=5.0)
Cholesterol: 186 mg/dL (ref 125–200)
HDL: 29 mg/dL — AB (ref >=46)
LDL Cholesterol: 99 mg/dL (ref <130)
Triglycerides: 292 mg/dL — ABNORMAL HIGH (ref <150)

## 2016-01-23 LAB — T4, FREE: FREE T4: 1.1 ng/dL (ref 0.8–1.8)

## 2016-01-23 LAB — TSH: TSH: 1.7 m[IU]/L

## 2016-01-23 LAB — VITAMIN D 25 HYDROXY (VIT D DEFICIENCY, FRACTURES): VIT D 25 HYDROXY: 31 ng/mL (ref 30–100)

## 2016-02-06 ENCOUNTER — Telehealth: Payer: Self-pay

## 2016-02-06 ENCOUNTER — Encounter (HOSPITAL_COMMUNITY): Payer: Self-pay | Admitting: *Deleted

## 2016-02-06 ENCOUNTER — Encounter: Payer: Self-pay | Admitting: Family Medicine

## 2016-02-06 ENCOUNTER — Ambulatory Visit (HOSPITAL_COMMUNITY)
Admission: EM | Admit: 2016-02-06 | Discharge: 2016-02-06 | Disposition: A | Discharge status: Home / Self Care | Payer: 59 | Attending: Family Medicine | Admitting: Family Medicine

## 2016-02-06 DIAGNOSIS — J40 Bronchitis, not specified as acute or chronic: Secondary | ICD-10-CM | POA: Diagnosis not present

## 2016-02-06 MED ORDER — HYDROCODONE-HOMATROPINE 5-1.5 MG/5ML PO SYRP: 5.0000 mL | ORAL_SOLUTION | Freq: Four times a day (QID) | ORAL | 0 refills | Status: DC | PRN

## 2016-02-06 MED ORDER — AZITHROMYCIN 250 MG PO TABS: 250.0000 mg | ORAL_TABLET | Freq: Every day | ORAL | 0 refills | Status: DC

## 2016-02-06 NOTE — Telephone Encounter (Signed)
Ad[facility], that she may need xrays, STAT blood work, and RX cough medication and antibiotics.  Pt expressed understanding and is agreeable.  Tiajuana Amass. Claressa Hughley, CMA

## 2016-02-06 NOTE — ED Triage Notes (Signed)
Congested  And   Non  Productive    Cough         Daughter  Has  Similar  symptoms

## 2016-02-06 NOTE — ED Provider Notes (Signed)
[facility]    CSN: [id number] Arrival date & time: 02/06/16  1353     History   Chief Complaint No chief complaint on file.   H[facility] Lacy DuverneyCanoy [name] is a [age] y.o. female.   This is a 30 year old woman with a 32-year-old daughter. Each of them has a severe cough to the point of vomiting. The daughter saw the pediatrician this morning and was given an inhaler.  The mother has not had wheezing, history of asthma, or smoking history. Her cough is intermittently productive without blood. She's had no fever. She's also had no shortness of breath      No past medical history on file.  Patient Active Problem List   Diagnosis Date Noted  . Iron deficiency anemia 01/22/2016  . Adjustment disorder with mixed anxiety >er depressed mood 01/22/2016  . Environmental and seasonal allergies 01/22/2016  . Situational anxiety 01/22/2016  . History of hyperlipidemia 01/22/2016  . BMI 36.0-36.9,adult 01/22/2016    Past Surgical History:  Procedure Laterality Date  . CESAREAN SECTION    . TUBAL LIGATION      OB History    No data available       Home Medications    Prior to Admission medications   Medication Sig Start Date End Date Taking? Authorizing Provider  azithromycin (ZITHROMAX) 250 MG tablet Take 1 tablet (250 mg total) by mouth daily. Take first 2 tablets together, then 1 every day until finished. 02/06/16   Elvina SidleKurt Voula Waln, MD  clonazePAM (KLONOPIN) 0.5 MG tablet Take 0.5 mg by mouth as needed for anxiety.    Historical Provider, MD  escitalopram (LEXAPRO) 10 MG tablet Take 1 tablet (10 mg total) by mouth daily. 01/22/16   Deborah Opalski, DO  HYDROcodone-homatropine (HYCODAN) 5-1.5 MG/5ML syrup Take 5 mLs by mouth every 6 (six) hours as needed for cough. 02/06/16   Elvina SidleKurt Dominik Lauricella, MD  loratadine (CLARITIN) 10 MG tablet Take 10 mg by mouth daily.    Historical Provider, MD  Multiple Vitamin (MULTIVITAMIN) tablet Take 1 tablet by mouth daily.    Historical  Provider, MD    Family History Family History  Problem Relation Age of Onset  . Cancer Maternal Grandmother 1082    ovarian  . Diabetes Maternal Grandmother 873    Social History Social History  Substance Use Topics  . Smoking status: Never Smoker  . Smokeless tobacco: Never Used  . Alcohol use No     Allergies   Patient has no known allergies.   Review of Systems Review of Systems  Constitutional: Positive for diaphoresis and fatigue.  HENT: Negative.   Respiratory: Positive for cough.   Cardiovascular: Negative.   Gastrointestinal: Negative.   Genitourinary: Negative.   Musculoskeletal: Negative.      Physical Exam Triage Vital Signs ED Triage Vitals  Enc Vitals Group     BP      Pulse      Resp      Temp      Temp src      SpO2      Weight      Height      Head Circumference      Peak Flow      Pain Score      Pain Loc      Pain Edu?      Excl. in GC?    No data found.   Updated Vital Signs LMP 01/05/2016 (Exact Date)   Physical Exam  Constitutional:  She is oriented to person, place, and time. She appears well-developed and well-nourished.  HENT:  Head: Normocephalic.  Right Ear: External ear normal.  Left Ear: External ear normal.  Mouth/Throat: Oropharynx is clear and moist.  Eyes: Conjunctivae are normal. Pupils are equal, round, and reactive to light.  Neck: Normal range of motion. Neck supple.  Cardiovascular: Normal rate, regular rhythm and normal heart sounds.   Pulmonary/Chest: Effort normal.  Bilateral rhonchi  Abdominal: Soft.  Musculoskeletal: Normal range of motion.  Lymphadenopathy:    She has no cervical adenopathy.  Neurological: She is alert and oriented to person, place, and time.  Skin: Skin is warm and dry.  Nursing note and vitals reviewed.    UC Treatments / Results  Labs (all labs ordered are listed, but only abnormal results are displayed) Labs Reviewed - No data to display  EKG  EKG Interpretation None        Radiology No results found.  Procedures Procedures (including critical care time)  Medications Ordered in UC Medications - No data to display   Initial Impression / Assessment and Plan / UC Course  I have reviewed the triage vital signs and the nursing notes.  Pertinent labs & imaging results that were available during my care of the patient were reviewed by me and considered in my medical decision making (see chart for details).  Clinical Course     Final Clinical Impressions(s) / UC Diagnoses   Final diagnoses:  Bronchitis    New Prescriptions New Prescriptions   AZITHROMYCIN (ZITHROMAX) 250 MG TABLET    Take 1 tablet (250 mg total) by mouth daily. Take first 2 tablets together, then 1 every day until finished.   HYDROCODONE-HOMATROPINE (HYCODAN) 5-1.5 MG/5ML SYRUP    Take 5 mLs by mouth every 6 (six) hours as needed for cough.     Elvina SidleKurt Sherilee Smotherman, MD 02/06/16 [phone]

## 2016-03-08 ENCOUNTER — Encounter: Payer: Self-pay | Admitting: Family Medicine

## 2016-03-08 ENCOUNTER — Ambulatory Visit (INDEPENDENT_AMBULATORY_CARE_PROVIDER_SITE_OTHER): Payer: 59 | Admitting: Family Medicine

## 2016-03-08 VITALS — BP 130/82 | HR 85 | Ht 64.0 in | Wt 222.2 lb

## 2016-03-08 DIAGNOSIS — F418 Other specified anxiety disorders: Secondary | ICD-10-CM | POA: Diagnosis not present

## 2016-03-08 DIAGNOSIS — D508 Other iron deficiency anemias: Secondary | ICD-10-CM | POA: Diagnosis not present

## 2016-03-08 DIAGNOSIS — Z6836 Body mass index (BMI) 36.0-36.9, adult: Secondary | ICD-10-CM

## 2016-03-08 DIAGNOSIS — F4323 Adjustment disorder with mixed anxiety and depressed mood: Secondary | ICD-10-CM

## 2016-03-08 DIAGNOSIS — Z23 Encounter for immunization: Secondary | ICD-10-CM

## 2016-03-08 DIAGNOSIS — E781 Pure hyperglyceridemia: Secondary | ICD-10-CM | POA: Insufficient documentation

## 2016-03-08 DIAGNOSIS — Z8639 Personal history of other endocrine, nutritional and metabolic disease: Secondary | ICD-10-CM

## 2016-03-08 MED ORDER — CLONAZEPAM 0.5 MG PO TABS: 0.5000 mg | ORAL_TABLET | ORAL | 0 refills | Status: DC | PRN

## 2016-03-08 NOTE — Assessment & Plan Note (Addendum)
phenteramine d/c pt and pt would really like to use it  Risks and benefits of medications discussed. Patient understands goal is to lose 5% of her weight every 4 weeks or else continuing on medication may be more of a risk than a benefit

## 2016-03-08 NOTE — Assessment & Plan Note (Signed)
Cont veg-life Vegan Iron Supp as cbc is stable.

## 2016-03-08 NOTE — Patient Instructions (Addendum)
You DO NOT have Pre-Diabetes but you might find this helpful for you to cut back on carbs.  4 wks- 11lb wt lose thru diet/ exercise.  Will start Dec 26th  ---> try wt loss of 6lbs by Jan 12th---> will then give phenteramine.  - lose it app to track foods is what I recommend     Prediabetes Eating Plan Prediabetes--also called impaired glucose tolerance or impaired fasting glucose--is a condition that causes blood sugar (blood glucose) levels to be higher than normal. Following a healthy diet can help to keep prediabetes under control. It can also help to lower the risk of type 2 diabetes and heart disease, which are increased in people who have prediabetes. Along with regular exercise, a healthy diet:  Promotes weight loss.  Helps to control blood sugar levels.  Helps to improve the way that the body uses insulin. WHAT DO I NEED TO KNOW ABOUT THIS EATING PLAN?  Use the glycemic index (GI) to plan your meals. The index tells you how quickly a food will raise your blood sugar. Choose low-GI foods. These foods take a longer time to raise blood sugar.  Pay close attention to the amount of carbohydrates in the food that you eat. Carbohydrates increase blood sugar levels.  Keep track of how many calories you take in. Eating the right amount of calories will help you to achieve a healthy weight. Losing about 7 percent of your starting weight can help to prevent type 2 diabetes.  You may want to follow a Mediterranean diet. This diet includes a lot of vegetables, lean meats or fish, whole grains, fruits, and healthy oils and fats. WHAT FOODS CAN I EAT? Grains Whole grains, such as whole-wheat or whole-grain breads, crackers, cereals, and pasta. Unsweetened oatmeal. Bulgur. Barley. Quinoa. Brown rice. Corn or whole-wheat flour tortillas or taco shells. Vegetables Lettuce. Spinach. Peas. Beets. Cauliflower. Cabbage. Broccoli. Carrots. Tomatoes. Squash. Eggplant. Herbs. Peppers. Onions.  Cucumbers. Brussels sprouts. Fruits Berries. Bananas. Apples. Oranges. Grapes. Papaya. Mango. Pomegranate. Kiwi. Grapefruit. Cherries. Meats and Other Protein Sources Seafood. Lean meats, such as chicken and [place] or lean cuts of pork and beef. Tofu. Eggs. Nuts. Beans. Dairy Low-fat or fat-free dairy products, such as yogurt, cottage cheese, and cheese. Beverages Water. Tea. Coffee. Sugar-free or diet soda. Seltzer water. Milk. Milk alternatives, such as soy or almond milk. Condiments Mustard. Relish. Low-fat, low-sugar ketchup. Low-fat, low-sugar barbecue sauce. Low-fat or fat-free mayonnaise. Sweets and Desserts Sugar-free or low-fat pudding. Sugar-free or low-fat ice cream and other frozen treats. Fats and Oils Avocado. Walnuts. Olive oil. The items listed above may not be a complete list of recommended foods or beverages. Contact your dietitian for more options.  WHAT FOODS ARE NOT RECOMMENDED? Grains Refined white flour and flour products, such as bread, pasta, snack foods, and cereals. Beverages Sweetened drinks, such as sweet iced tea and soda. Sweets and Desserts Baked goods, such as cake, cupcakes, pastries, cookies, and cheesecake. The items listed above may not be a complete list of foods and beverages to avoid. Contact your dietitian for more information.   This information is not intended to replace advice given to you by your health care provider. Make sure you discuss any questions you have with your health care provider.   Document Released: 07/23/2014 Document Reviewed: 07/23/2014 Elsevier Interactive Patient Education 2016 [employer]     Risk factors for prediabetes and type 2 diabetes  Researchers don't fully understand why some people develop prediabetes and type 2 diabetes  and others don't.  It's clear that certain factors increase the risk, however, including:  Weight. The more fatty tissue you have, the more resistant your cells become to insulin.    Inactivity. The less active you are, the greater your risk. Physical activity helps you control your weight, uses up glucose as energy and makes your cells more sensitive to insulin.  Family history. Your risk increases if a parent or sibling has type 2 diabetes.  Race. Although it's unclear why, people of certain races - including blacks, Hispanics, American Indians and Asian-Americans - are at higher risk.  Age. Your risk increases as you get older. This may be because you tend to exercise less, lose muscle mass and gain weight as you age. But type 2 diabetes is also increasing dramatically among children, adolescents and younger adults.  Gestational diabetes. If you developed gestational diabetes when you were pregnant, your risk of developing prediabetes and type 2 diabetes later increases. If you gave birth to a baby weighing more than 9 pounds (4 kilograms), you're also at risk of type 2 diabetes.  Polycystic ovary syndrome. For women, having polycystic ovary syndrome - a common condition characterized by irregular menstrual periods, excess hair growth and obesity - increases the risk of diabetes.  High blood pressure. Having blood pressure over 140/90 millimeters of mercury (mm Hg) is linked to an increased risk of type 2 diabetes.  Abnormal cholesterol and triglyceride levels. If you have low levels of high-density lipoprotein (HDL), or "good," cholesterol, your risk of type 2 diabetes is higher. Triglycerides are another type of fat carried in the blood. People with high levels of triglycerides have an increased risk of type 2 diabetes. Your doctor can let you know what your cholesterol and triglyceride levels are.    A good guide to good carbs: The glycemic index ---If you have diabetes, or at risk for diabetes, you know all too well that when you eat carbohydrates, your blood sugar goes up. The total amount of carbs you consume at a meal or in a snack mostly determines what your blood sugar  will do. But the food itself also plays a role. A serving of white rice has almost the same effect as eating pure table sugar - a quick, high spike in blood sugar. A serving of lentils has a slower, smaller effect.  ---Picking good sources of carbs can help you control your blood sugar and your weight. Even if you don't have diabetes, eating healthier carbohydrate-rich foods can help ward off a host of chronic conditions, from heart disease to various cancers to, well, diabetes.  ---One way to choose foods is with the glycemic index (GI). This tool measures how much a food boosts blood sugar.  The glycemic index rates the effect of a specific amount of a food on blood sugar compared with the same amount of pure glucose. A food with a glycemic index of 28 boosts blood sugar only 28% as much as pure glucose. One with a GI of 95 acts like pure glucose.  High glycemic foods result in a quick spike in insulin and blood sugar (also known as blood glucose).  Low glycemic foods have a slower, smaller effect- these are healthier for you.   Using the glycemic index Using the glycemic index is easy: choose foods in the low GI category instead of those in the high GI category (see below), and go easy on those in between. Low glycemic index (GI of 55 or less): Most  fruits and vegetables, beans, minimally processed grains, pasta, low-fat dairy foods, and nuts.  Moderate glycemic index (GI 56 to 69): White and sweet potatoes, corn, white rice, couscous, breakfast cereals such as Cream of Wheat and Mini Wheats.  High glycemic index (GI of 70 or higher): White bread, rice cakes, most crackers, bagels, cakes, doughnuts, croissants, most packaged breakfast cereals. You can see the values for 100 commons foods and get links to more at [email or web address].hu.  Swaps for lowering glycemic index  Instead of this high-glycemic index food Eat this lower-glycemic index food  White rice Brown rice or converted rice    Instant oatmeal Steel-cut oats  Cornflakes Bran flakes  Baked potato Pasta, bulgur  White bread Whole-grain bread  Corn Peas or leafy greens        Guidelines for a Low Cholesterol, Low Saturated Fat Diet   Fats - Limit total intake of fats and oils. - Avoid butter, stick margarine, shortening, lard, palm and coconut oils. - Limit mayonnaise, salad dressings, gravies and sauces, unless they are homemade with low-fat ingredients. - Limit chocolate. - Choose low-fat and nonfat products, such as low-fat mayonnaise, low-fat or non-hydrogenated peanut butter, low-fat or fat-free salad dressings and nonfat gravy. - Use vegetable oil, such as canola or olive oil. - Look for margarine that does not contain trans fatty acids. - Use nuts in moderate amounts. - Read ingredient labels carefully to determine both amount and type of fat present in foods. Limit saturated and trans fats! - Avoid high-fat processed and convenience foods.  Meats and Meat Alternatives - Choose fish, chicken, [place] and lean meats. - Use dried beans, peas, lentils and tofu. - Limit egg yolks to three to four per week. - If you eat red meat, limit to no more than three servings per week and choose loin or round cuts. - Avoid fatty meats, such as bacon, sausage, franks, luncheon meats and ribs. - Avoid all organ meats, including liver.  Dairy - Choose nonfat or low-fat milk, yogurt and cottage cheese. - Most cheeses are high in fat. Choose cheeses made from non-fat milk, such as mozzarella and ricotta cheese. - Choose light or fat-free cream cheese and sour cream. - Avoid cream and sauces made with cream.  Fruits and Vegetables - Eat a wide variety of fruits and vegetables. - Use lemon juice, vinegar or "mist" olive oil on vegetables. - Avoid adding sauces, fat or oil to vegetables.  Breads, Cereals and Grains - Choose whole-grain breads, cereals, pastas and rice. - Avoid high-fat snack foods, such as  granola, cookies, pies, pastries, doughnuts and croissants.  Cooking Tips - Avoid deep fried foods. - Trim visible fat off meats and remove skin from poultry before cooking. - Bake, broil, boil, poach or roast poultry, fish and lean meats. - Drain and discard fat that drains out of meat as you cook it. - Add little or no fat to foods. - Use vegetable oil sprays to grease pans for cooking or baking. - Steam vegetables. - Use herbs or no-oil marinades to flavor foods.      The quick and dirty--> lower triglyceride levels more through...  1) - Beware of bad fats: Cutting back on saturated fat (in red meat and full-fat dairy foods) and trans fats (in restaurant fried foods and commercially prepared baked goods) can lower triglycerides.  2) - Go for good carbs: Easily digested carbohydrates (such as white bread, white rice, cornflakes, and sugary sodas) give triglycerides a definite boost.  3) - Eating whole grains and cutting back on soda can help control triglycerides.  4) - Check your alcohol use. In some people, alcohol dramatically boosts triglycerides. The only way to know if this is true for you is to avoid alcohol for a few weeks and have your triglycerides tested again.  5) - Go fish. Omega-3 fats in salmon, tuna, sardines, and other fatty fish can lower triglycerides. Having fish twice a week is fine.  6) - Aim for a healthy weight. If you are overweight, losing just 5% to 10% of your weight can help drive down triglycerides.  7) - Get moving. Exercise lowers triglycerides and boosts heart-healthy HDL cholesterol.  8) - quit smoking if you do      For more detailed info----->   For those diagnosed with high triglycerides, it's important to take action to lower your levels and improve your heart health.  Triglyceride is just a fancy word for fat - the fat in our bodies is stored in the form of triglycerides. Triglycerides are found in foods and manufactured in our  bodies.  Normal triglyceride levels are defined as less than 150 mg/dL; 161150 to 096199 is considered borderline high; 200 to 499 is high; and 500 or higher is officially called very high. To me, anything over 150 is a red flag indicating my patient needs to take immediate steps to get the situation under control.   What is the significance of high triglycerides? High triglyceride levels make blood thicker and stickier, which means that it is more likely to form clots. Studies have shown that triglyceride levels are associated with increased risks of cardiovascular disease and stroke - in both men and women - alone or in combination with other risk factors (high triglycerides combined with high LDL cholesterol can be a particularly deadly combination). For example, in one ground-breaking study, high triglycerides alone increased the risk of cardiovascular disease by 14 percent in men, and by 37 percent in women. But when the test subjects also had low HDL cholesterol (that's the good cholesterol) and other risk factors, high triglycerides increased the risk of disease by 32 percent in men and 76 percent in women.   Fortunately, triglycerides can sometimes be controlled with several diet and lifestyle changes.    What Factors Can Increase Triglycerides? As with cholesterol, eating too much of the wrong kinds of fats will raise your blood triglycerides.  Therefore, it's important to restrict the amounts of saturated fats and trans fats you allow into your diet.  Triglyceride levels can also shoot up after eating foods that are high in carbohydrates or after drinking alcohol.  That's why triglyceride blood tests require an overnight fast.  If you have elevated triglycerides, it's especially important to avoid sugary and refined carbohydrates, including sugar, honey, and other sweeteners, soda and other sugary drinks, candy, baked goods, and anything made with white (refined or enriched) flour, including white bread,  rolls, cereals, buns, pastries, regular pasta, and white rice.  You'll also want to limit dried fruit and fruit juice since they're dense in simple sugar.  All of these low-quality carbs cause a sudden rise in insulin, which may lead to a spike in triglycerides.  Triglycerides can also become elevated as a reaction to having diabetes, hypothyroidism, or kidney disease. As with most other heart-related factors, being overweight and inactive also contribute to abnormal triglycerides. And unfortunately, some people have a genetic predisposition that causes them to manufacture way too much triglycerides on their  own, no matter how carefully they eat.   How Can You Lower Your Triglyceride Levels? If you are diagnosed with high triglycerides, it's important to take action. There are several things you can do to help lower your triglyceride levels and improve your heart health:  Lose weight if you are overweight.  There is a clear correlation between obesity and high triglycerides - the heavier people are, the higher their triglyceride levels are likely to be. The good news is that losing weight can significantly lower triglycerides. In a large study of individuals with type 2 diabetes, those assigned to the "lifestyle intervention group" - which involved counseling, a low-calorie meal plan, and customized exercise program - lost 8.6% of their body weight and lowered their triglyceride levels by more than 16%. If you're overweight, find a weight loss plan that works for you and commit to shedding the pounds and getting healthier.  Reduce the amount of saturated fat and trans fat in your diet.  Start by avoiding or dramatically limiting butter, cream cheese, lard, sour cream, doughnuts, cakes, cookies, candy bars, regular ice cream, fried foods, pizza, cheese sauce, cream-based sauces and salad dressings, high-fat meats (including fatty hamburgers, bologna, pepperoni, sausage, bacon, salami, pastrami, spareribs,  and hot dogs), high-fat cuts of beef and pork, and whole-milk dairy products.   Other ways to cut back: Choose lean meats only (including skinless chicken and [place], lean beef, lean pork), fish, and reduced-fat or fat-free dairy products.   Experiment with adding whole soy foods to your diet. Although soy itself may not reduce risk of heart disease, it replaces hazardous animal fats with healthier proteins. Choose high-quality soy foods, such as tofu, tempeh, soy milk, and edamame (whole soybeans).  Always remove skin from poultry.  Prepare foods by baking, roasting, broiling, boiling, poaching, steaming, grilling, or stir-frying in vegetable oil.  Most stick margarines contain trans fats, and trans fats are also found in some packaged baked goods, potato chips, snack foods, fried foods, and fast food that use or create hydrogenated oils.    (All food labels must now list the amount of trans fats, right after the amount of saturated fats - good news for consumers. As a result, many food companies have now reformulated their products to be trans fat free.many, but not all! So it's still just as important to read labels and make sure the packaged foods you buy don't contain trans fats.)     If you use margarine, purchase soft-tub margarine spreads that contain 0 grams trans fats and don't list any partially hydrogenated oils in the ingredients list. By substituting olive oil or vegetable oil for trans fats in just 2 percent of your daily calories, you can reduce your risk of heart disease by 53 percent.   There is no safe amount of trans fats, so try to keep them as far from your plate as possible.  Avoid foods that are concentrated in sugar (even dried fruit and fruit juice). Sugary foods can elevate triglyceride levels in the blood, so keep them to a bare minimum.  Swap out refined carbohydrates for whole grains.  Refined carbohydrates - like white rice, regular pasta, and anything made with white  or "enriched" flour (including white bread, rolls, cereals, buns, and crackers) - raise blood sugar and insulin levels more than fiber-rich whole grains. Higher insulin levels, in turn, can lead to a higher rise in triglycerides after a meal. So, make the switch to whole wheat bread, whole grain pasta, brown  or wild rice, and whole grain versions of cereals, crackers, and other bread products. However, it's important to know that individuals with high triglycerides should moderate even their intake of high-quality starches (since all starches raise blood sugar) - I recommend 1 to 2 servings per meal.  Cut way back on alcohol.  If you have high triglycerides, alcohol should be considered a rare treat - if you indulge at all, since even small amounts of alcohol can dramatically increase triglyceride levels.  Incorporate omega-3 fats.  Heart-healthy fish oils are especially rich in omega-3 fatty acids. In multiple studies over the past two decades, people who ate diets high in omega-3s had 30 to 40 percent reductions in heart disease. Although we don't yet know why fish oil works so well, there are several possibilities. Omega-3s seem to reduce inflammation, reduce high blood pressure, decrease triglycerides, raise HDL cholesterol, and make blood thinner and less sticky so it is less likely to clot. It's as close to a food prescription for heart health as it gets. If you have high triglycerides, I recommend eating at least three servings of one of the omega-3-rich fish every week (fatty fish is the most concentrated food form of omega three fats). If you cannot manage to eat that much fish, speak with your physician about taking fish oil capsules, which offer similar benefits.The best foods for omega-3 fatty acids include wild salmon (fresh, canned), herring, mackerel (not king), sardines, anchovies, rainbow trout, and Pacific oysters. Non-fish sources of omega-3 fats include omega-3-fortified eggs, ground  flaxseed, chia seeds, walnuts, butternuts (white walnuts), seaweed, walnut oil, canola oil, and soybeans.  Quit smoking.  Smoking causes inflammation, not just in your lungs, but throughout your body. Inflammation can contribute to atherosclerosis, blood clots, and risk of heart attack. Smoking makes all heart health indicators worse. If you have high cholesterol, high triglycerides, or high blood pressure, smoking magnifies the danger.  Become more physically active.  Even moderate exercise can help improve cholesterol, triglycerides, and blood pressure. Aerobic exercise seems to be able to stop the sharp rise of triglycerides after eating, perhaps because of a decrease in the amount of triglyceride released by the liver, or because active muscle clears triglycerides out of the blood stream more quickly than inactive muscle. If you haven't exercised regularly (or at all) for years, I recommend starting slowly, by walking at an easy pace for 15 minutes a day. Then, as you feel more comfortable, increase the amount. Your ultimate goal should be at least 30 minutes of moderate physical activity, at least five days a week.

## 2016-03-08 NOTE — Progress Notes (Signed)
Assessment and plan:  1. Situational anxiety   2. Adjustment disorder with mixed anxiety >er depressed mood   3. BMI 36.0-36.9,adult   4. History of hyperlipidemia   5. Hypertriglyceridemia   6. Other iron deficiency anemia   7. Need for Tdap vaccination     Iron deficiency anemia Cont veg-life Vegan Iron Supp as cbc is stable.   BMI 36.0-36.9,adult phenteramine d/c pt and pt would really like to use it  Risks and benefits of medications discussed. Patient understands goal is to lose 5% of her weight every 4 weeks or else continuing on medication may be more of a risk than a benefit  Hypertriglyceridemia Handouts provided after counseling done on decreasing carbohydrate intake  History of hyperlipidemia Counseling done, handouts provided. Recheck 6 months.  Situational anxiety Refill Xanax given. Will be given only once yearly  Adjustment disorder with mixed anxiety >er depressed mood Been on Lexapro for years now- tolerated restarting the medicine at last office visit  - Xanax only occasionally as needed- once or twice per month is ok    New Prescriptions   No medications on file    Modified Medications   Modified Medication Previous Medication   CLONAZEPAM (KLONOPIN) 0.5 MG TABLET clonazePAM (KLONOPIN) 0.5 MG tablet      Take 1 tablet (0.5 mg total) by mouth as needed for anxiety.    Take 0.5 mg by mouth as needed for anxiety.    Discontinued Medications   AZITHROMYCIN (ZITHROMAX) 250 MG TABLET    Take 1 tablet (250 mg total) by mouth daily. Take first 2 tablets together, then 1 every day until finished.   HYDROCODONE-HOMATROPINE (HYCODAN) 5-1.5 MG/5ML SYRUP    Take 5 mLs by mouth every 6 (six) hours as needed for cough.     Return for f/up jan 12th. 6lb wt loss. try wt loss of 6lbs by Jan 12th---> will then give phenteramine.   Anticipatory guidance and routine counseling done re: condition,  txmnt options and need for follow up. All questions of patient's were answered.   Gross side effects, risk and benefits, and alternatives of medications discussed with patient.  Patient is aware that all medications have potential side effects and we are unable to predict every sideeffect or drug-drug interaction that may occur.  Expresses verbal understanding and consents to current therapy plan and treatment regiment.  Please see AVS handed out to patient at the end of our visit for additional patient instructions/ counseling done pertaining to today's office visit.  Note: This document was prepared using Dragon voice recognition software and may include unintentional dictation errors.   ----------------------------------------------------------------------------------------------------------------------  Subjective:   CC:   [name] [name] is a 30 y.o. female who presents to [place] [facility] today for review and discussion of recent bloodwork that was done.  1. All recent blood work that we ordered was reviewed with patient today.  Patient was counseled on all abnormalities and we discussed dietary and lifestyle changes that could help those values (also medications when appropriate).  Extensive health counseling performed and all patient's concerns/ questions were addressed.   - Anxiety/ panic:  Takes Xanax only about 1-2 times per month. Has not been filled per pt for over a year now.  Requests RF.   Lexapro daily.  Tolerating it well- doesn't feel she needs more.   - Obesity: Gained a little wt- was sick and laying around a lot. ice cream  felt good on throat.  Diet very rich in carbohydrates and fast foods.     Wt Readings from Last 3 Encounters:  03/08/16 222 lb 3.2 oz (100.8 kg)  01/22/16 215 lb 8 oz (97.8 kg)  11/01/13 227 lb (103 kg)   BP Readings from Last 3 Encounters:  03/08/16 130/82  02/06/16 124/82  01/22/16 135/85   Pulse Readings from  Last 3 Encounters:  03/08/16 85  02/06/16 82  01/22/16 83   BMI Readings from Last 3 Encounters:  03/08/16 38.14 kg/m  01/22/16 36.99 kg/m  11/01/13 38.96 kg/m     Patient Care Team    Relationship Specialty Notifications Start End  Mellody Dance, DO PCP - General Family Medicine  01/22/16   Almedia Balls, MD Consulting Physician Orthopedic Surgery  01/22/16   Malachy Mood, MD Referring Physician Obstetrics and Gynecology  01/22/16   Marica Otter, OD  Optometry  01/22/16     Full medical history updated and reviewed in the office today  Patient Active Problem List   Diagnosis Date Noted  . Hypertriglyceridemia 03/08/2016    Priority: High  . Adjustment disorder with mixed anxiety >er depressed mood 01/22/2016    Priority: High  . Situational anxiety 01/22/2016    Priority: High  . History of hyperlipidemia 01/22/2016    Priority: High  . BMI 36.0-36.9,adult 01/22/2016    Priority: High  . Iron deficiency anemia 01/22/2016    Priority: Medium  . Environmental and seasonal allergies 01/22/2016    Priority: Low    History reviewed. No pertinent past medical history.  Past Surgical History:  Procedure Laterality Date  . CESAREAN SECTION    . TUBAL LIGATION      Social History  Substance Use Topics  . Smoking status: Never Smoker  . Smokeless tobacco: Never Used  . Alcohol use No    Family Hx: Family History  Problem Relation Age of Onset  . Cancer Maternal Grandmother 14    ovarian  . Diabetes Maternal Grandmother 19     Medications: Current Outpatient Prescriptions  Medication Sig Dispense Refill  . clonazePAM (KLONOPIN) 0.5 MG tablet Take 1 tablet (0.5 mg total) by mouth as needed for anxiety. 30 tablet 0  . escitalopram (LEXAPRO) 10 MG tablet Take 1 tablet (10 mg total) by mouth daily. 90 tablet 1  . loratadine (CLARITIN) 10 MG tablet Take 10 mg by mouth daily.    . Multiple Vitamin (MULTIVITAMIN) tablet Take 1 tablet by mouth daily.    Marland Kitchen OVER  THE COUNTER MEDICATION Take 1 tablet by mouth daily.     No current facility-administered medications for this visit.     Allergies:  No Known Allergies   ROS: Review of Systems  Constitutional: Negative for chills and fever.  HENT: Negative for congestion and sinus pain.   Eyes: Negative for blurred vision and double vision.  Respiratory: Negative for shortness of breath and wheezing.   Cardiovascular: Negative for chest pain, palpitations and orthopnea.  Gastrointestinal: Negative for constipation, diarrhea, heartburn, nausea and vomiting.  Genitourinary: Negative for frequency and hematuria.  Musculoskeletal: Negative for falls and joint pain.       Chronic jt pains  Skin: Negative for rash.  Neurological: Negative for dizziness, sensory change and focal weakness.  Endo/Heme/Allergies: Negative for polydipsia.  Psychiatric/Behavioral: Negative for depression and suicidal ideas. The patient is not nervous/anxious and does not have insomnia.     Objective:  Blood pressure 130/82, pulse 85, height 5' 4"  (1.626 m),  weight 222 lb 3.2 oz (100.8 kg), last menstrual period 02/11/2016. Body mass index is 38.14 kg/m. General: Well Developed, well nourished, appropriate for stated age.  Neuro: Alert and oriented, extra-ocular muscles intact HEENT: Normocephalic, atraumatic, pupils equal round reactive, neck supple Skin: Warm, pink and dry, no gross rash. Cardiac: rate regular Respiratory: Not using accessory muscles, speaking in full sentences-unlabored. Vascular:  cap RF less 2 sec. Psych: No HI/SI, judgement and insight good, Euthymic mood. Full Affect.   Recent Results (from the past 2160 hour(s))  CBC with Differential/Platelet     Status: None   Collection Time: 01/22/16  9:20 AM  Result Value Ref Range   WBC 6.5 3.8 - 10.8 K/uL   RBC 4.15 3.80 - 5.10 MIL/uL   Hemoglobin 12.0 11.7 - 15.5 g/dL   HCT 36.9 35.0 - 45.0 %   MCV 88.9 80.0 - 100.0 fL   MCH 28.9 27.0 - 33.0 pg     MCHC 32.5 32.0 - 36.0 g/dL   RDW 14.6 11.0 - 15.0 %   Platelets 309 140 - 400 K/uL   MPV 9.7 7.5 - 12.5 fL   Neutro Abs 3,575 1,500 - 7,800 cells/uL   Lymphs Abs 2,275 850 - 3,900 cells/uL   Monocytes Absolute 585 200 - 950 cells/uL   Eosinophils Absolute 65 15 - 500 cells/uL   Basophils Absolute 0 0 - 200 cells/uL   Neutrophils Relative % 55 %   Lymphocytes Relative 35 %   Monocytes Relative 9 %   Eosinophils Relative 1 %   Basophils Relative 0 %   Smear Review Criteria for review not met   COMPLETE METABOLIC PANEL WITH GFR     Status: None   Collection Time: 01/22/16  9:20 AM  Result Value Ref Range   Sodium 140 135 - 146 mmol/L   Potassium 5.1 3.5 - 5.3 mmol/L   Chloride 102 98 - 110 mmol/L   CO2 28 20 - 31 mmol/L   Glucose, Bld 88 65 - 99 mg/dL   BUN 9 7 - 25 mg/dL   Creat 0.61 0.50 - 1.10 mg/dL   Total Bilirubin 0.3 0.2 - 1.2 mg/dL   Alkaline Phosphatase 51 33 - 115 U/L   AST 15 10 - 30 U/L   ALT 14 6 - 29 U/L   Total Protein 7.4 6.1 - 8.1 g/dL   Albumin 4.3 3.6 - 5.1 g/dL   Calcium 9.5 8.6 - 10.2 mg/dL   GFR, Est African American >89 >=60 mL/min   GFR, Est Non African American >89 >=60 mL/min  Hemoglobin A1c     Status: None   Collection Time: 01/22/16  9:20 AM  Result Value Ref Range   Hgb A1c MFr Bld 5.1 <5.7 %    Comment:   For the purpose of screening for the presence of diabetes:   <5.7%       Consistent with the absence of diabetes 5.7-6.4 %   Consistent with increased risk for diabetes (prediabetes) >=6.5 %     Consistent with diabetes   This assay result is consistent with a decreased risk of diabetes.   Currently, no consensus exists regarding use of hemoglobin A1c for diagnosis of diabetes in children.   According to American Diabetes Association (ADA) guidelines, hemoglobin A1c <7.0% represents optimal control in non-pregnant diabetic patients. Different metrics may apply to specific patient populations. Standards of Medical Care in Diabetes  (ADA).      Mean Plasma Glucose 100 mg/dL  Lipid  Panel w/reflex Direct LDL     Status: Abnormal   Collection Time: 01/22/16  9:20 AM  Result Value Ref Range   Cholesterol 186 125 - 200 mg/dL   Triglycerides 292 (H) <150 mg/dL   HDL 29 (L) >=46 mg/dL   Total CHOL/HDL Ratio 6.4 (H) <=5.0 Ratio   LDL Cholesterol 99 <130 mg/dL    Comment:   Total Cholesterol/HDL Ratio:CHD Risk                        Coronary Heart Disease Risk Table                                        Men       Women          1/2 Average Risk              3.4        3.3              Average Risk              5.0        4.4           2X Average Risk              9.6        7.1           3X Average Risk             23.4       11.0 Use the calculated Patient Ratio above and the CHD Risk table  to determine the patient's CHD Risk.   T4, free     Status: None   Collection Time: 01/22/16  9:20 AM  Result Value Ref Range   Free T4 1.1 0.8 - 1.8 ng/dL  TSH     Status: None   Collection Time: 01/22/16  9:20 AM  Result Value Ref Range   TSH 1.70 mIU/L    Comment:   Reference Range   > or = 20 Years  0.40-4.50   Pregnancy Range First trimester  0.26-2.66 Second trimester 0.55-2.73 Third trimester  0.43-2.91     VITAMIN D 25 Hydroxy (Vit-D Deficiency, Fractures)     Status: None   Collection Time: 01/22/16  9:20 AM  Result Value Ref Range   Vit D, 25-Hydroxy 31 30 - 100 ng/mL    Comment: Vitamin D Status           25-OH Vitamin D        Deficiency                <20 ng/mL        Insufficiency         20 - 29 ng/mL        Optimal             > or = 30 ng/mL   For 25-OH Vitamin D testing on patients on D2-supplementation and patients for whom quantitation of D2 and D3 fractions is required, the QuestAssureD 25-OH VIT D, (D2,D3), LC/MS/MS is recommended: order code [id number] (patients > 2 yrs).

## 2016-03-08 NOTE — Assessment & Plan Note (Signed)
Been on Lexapro for years now- tolerated restarting the medicine at last office visit  - Xanax only occasionally as needed- once or twice per month is ok

## 2016-03-08 NOTE — Assessment & Plan Note (Addendum)
Handouts provided after counseling done on decreasing carbohydrate intake

## 2016-03-08 NOTE — Assessment & Plan Note (Signed)
Counseling done, handouts provided. Recheck 6 months.

## 2016-03-08 NOTE — Assessment & Plan Note (Signed)
Refill Xanax given. Will be given only once yearly

## 2016-03-31 ENCOUNTER — Ambulatory Visit: Payer: 59 | Admitting: Family Medicine

## 2016-08-17 ENCOUNTER — Other Ambulatory Visit: Payer: Self-pay | Admitting: Family Medicine

## 2016-08-19 ENCOUNTER — Telehealth: Payer: Self-pay | Admitting: Family Medicine

## 2016-08-19 NOTE — Telephone Encounter (Signed)
Spoke with patient regarding her 90 day supply of lexapro.  Advised her she needed to have an appointment.  Appointment made.

## 2016-08-19 NOTE — Telephone Encounter (Signed)
Patient states that she received her refill of her Lexapro from [facility] but it was for only a 30 day supply. Patient is wondering why that is, she has always received it for a 90 day supply and would like to continue with it like that as it is cheaper through her insurance if she gets it 3 mnths at a time, as well as not having to go to the pharmacy as frequently. She would like to speak to someone about this issue.

## 2016-09-02 ENCOUNTER — Encounter: Payer: Self-pay | Admitting: Family Medicine

## 2016-09-02 ENCOUNTER — Ambulatory Visit (INDEPENDENT_AMBULATORY_CARE_PROVIDER_SITE_OTHER): Payer: 59 | Admitting: Family Medicine

## 2016-09-02 VITALS — BP 120/82 | HR 83 | Ht 64.0 in | Wt 204.5 lb

## 2016-09-02 DIAGNOSIS — Z6836 Body mass index (BMI) 36.0-36.9, adult: Secondary | ICD-10-CM

## 2016-09-02 DIAGNOSIS — F418 Other specified anxiety disorders: Secondary | ICD-10-CM

## 2016-09-02 DIAGNOSIS — B354 Tinea corporis: Secondary | ICD-10-CM | POA: Diagnosis not present

## 2016-09-02 DIAGNOSIS — F4323 Adjustment disorder with mixed anxiety and depressed mood: Secondary | ICD-10-CM

## 2016-09-02 NOTE — Progress Notes (Signed)
Assessment and plan:  1. Adjustment disorder with mixed anxiety >er depressed mood   2. Situational anxiety   3. BMI 36.0-36.9,adult   4. Tinea corporis- pannus of abd    - cont meds for anxiety/ depression - cont exercise and wt loss as this is helping for emntally and physically - mention to patient that goal weight fo rnow 174--> opverwt.   Then we reassess  Tinea corporis- pannus of abd Symptoms mild today.  Told to use LAMISIL AND ANTIFUNGAL POWDER>  Pt was in the office today for 40+ minutes, with over 50% time spent in face to face counseling of patients various medical conditions, treatment plans of those medical conditions including medicine management and lifestyle modification, strategies to improve health and well being; and in coordination of care.  We discussed diet, exercise habits, how to get rid of her belly fat, core strengthening and diet meds in future if needed.  New Prescriptions   No medications on file    Modified Medications   No medications on file    Discontinued Medications   OVER THE COUNTER MEDICATION    Take 1 tablet by mouth daily.     Return in about 6 months (around 03/04/2017). try wt loss of 6lbs by Jan 12th---> will then give phenteramine.   Anticipatory guidance and routine counseling done re: condition, txmnt options and need for follow up. All questions of patient's were answered.   Gross side effects, risk and benefits, and alternatives of medications discussed with patient.  Patient is aware that all medications have potential side effects and we are unable to predict every sideeffect or drug-drug interaction that may occur.  Expresses verbal understanding and consents to current therapy plan and treatment regiment.  Please see AVS handed out to patient at the end of our visit for additional patient instructions/ counseling done pertaining to today's office  visit.  Note: This document was prepared using Dragon voice recognition software and may include unintentional dictation errors.   ----------------------------------------------------------------------------------------------------------------------  Subjective:   CC:   [name] [name] is a 31 y.o. female who presents to [facility] today for review and discussion of recent bloodwork that was done.  - Anxiety/ panic:  Takes Xanax only about 1 times per month- now.   Has not been filled per pt for over a year now.  Lexapro daily.  Tolerating it well- doesn't feel she needs more.   Vit D def- started vitamin D.   - Obesity:  Ketogenic diet since Feb 12th.  At mother's day---> pt started exerciging-->  every moprning--> 6d/wk and walking at the track---> [employer].  Everyday during the week now wlaking    IN Feb 12th-- 235.1--> today  204.9 today.   Gave up pizza--> now salmon night.  Husband lost 40+ pounds and she lost almost 30lbs.   Fitbit--> 16k steps per day on average.   PHQ is 1 and GAD today is 4.  Wt Readings from Last 3 Encounters:  09/02/16 204 lb 8 oz (92.8 kg)  03/08/16 222 lb 3.2 oz (100.8 kg)  01/22/16 215 lb 8 oz (97.8 kg)   BP Readings from Last 3 Encounters:  09/02/16 120/82  03/08/16 130/82  02/06/16 124/82   Pulse Readings from Last 3 Encounters:  09/02/16 83  03/08/16 85  02/06/16 82   BMI Readings from Last 3 Encounters:  09/02/16 35.10 kg/m  03/08/16  38.14 kg/m  01/22/16 36.99 kg/m     Patient Care Team    Relationship Specialty Notifications Start End  Thomasene Lot, DO PCP - General Family Medicine  01/22/16   Lunette Stands, MD Consulting Physician Orthopedic Surgery  01/22/16   Vena Austria, MD Referring Physician Obstetrics and Gynecology  01/22/16   Blima Ledger, OD  Optometry  01/22/16     Full medical history updated and reviewed in the office today  Patient  Active Problem List   Diagnosis Date Noted  . Hypertriglyceridemia 03/08/2016    Priority: High  . Adjustment disorder with mixed anxiety >er depressed mood 01/22/2016    Priority: High  . Situational anxiety 01/22/2016    Priority: High  . History of hyperlipidemia 01/22/2016    Priority: High  . BMI 36.0-36.9,adult 01/22/2016    Priority: High  . Iron deficiency anemia 01/22/2016    Priority: Medium  . Environmental and seasonal allergies 01/22/2016    Priority: Low  . Tinea corporis- pannus of abd 09/02/2016    History reviewed. No pertinent past medical history.  Past Surgical History:  Procedure Laterality Date  . CESAREAN SECTION    . TUBAL LIGATION      Social History  Substance Use Topics  . Smoking status: Never Smoker  . Smokeless tobacco: Never Used  . Alcohol use No    Family Hx: Family History  Problem Relation Age of Onset  . Cancer Maternal Grandmother 54       ovarian  . Diabetes Maternal Grandmother 82     Medications: Current Outpatient Prescriptions  Medication Sig Dispense Refill  . Cholecalciferol (VITAMIN D3) 5000 units CAPS Take 1 capsule by mouth daily.    . Ferrous Gluconate (IRON) 240 (27 Fe) MG TABS Take 1 tablet by mouth daily.    . clonazePAM (KLONOPIN) 0.5 MG tablet Take 1 tablet (0.5 mg total) by mouth as needed for anxiety. 30 tablet 0  . escitalopram (LEXAPRO) 10 MG tablet TAKE 1 TABLET (10 MG TOTAL) BY MOUTH DAILY. 30 tablet 0  . loratadine (CLARITIN) 10 MG tablet Take 10 mg by mouth daily.    . Multiple Vitamin (MULTIVITAMIN) tablet Take 1 tablet by mouth daily.     No current facility-administered medications for this visit.     Allergies:  No Known Allergies   ROS: Review of Systems  Constitutional: Negative for chills and fever.  HENT: Negative for congestion and sinus pain.   Eyes: Negative for blurred vision and double vision.  Respiratory: Negative for shortness of breath and wheezing.   Cardiovascular:  Negative for chest pain, palpitations and orthopnea.  Gastrointestinal: Negative for constipation, diarrhea, heartburn, nausea and vomiting.  Genitourinary: Negative for frequency and hematuria.  Musculoskeletal: Negative for falls and joint pain.       Chronic jt pains  Skin: Negative for rash.  Neurological: Negative for dizziness, sensory change and focal weakness.  Endo/Heme/Allergies: Negative for polydipsia.  Psychiatric/Behavioral: Negative for depression and suicidal ideas. The patient is not nervous/anxious and does not have insomnia.     Objective:  Blood pressure 120/82, pulse 83, height 5\' 4"  (1.626 m), weight 204 lb 8 oz (92.8 kg), last menstrual period 08/25/2016. Body mass index is 35.1 kg/m. General: Well Developed, well nourished, appropriate for stated age.  Neuro: Alert and oriented, extra-ocular muscles intact HEENT: Normocephalic, atraumatic, pupils equal round reactive, neck supple Skin: Warm, pink and dry, no gross rash. Cardiac: rate regular Respiratory: Not using accessory muscles, speaking in full  sentences-unlabored. Vascular:  cap RF less 2 sec. Psych: No HI/SI, judgement and insight good, Euthymic mood. Full Affect.   No results found for this or any previous visit (from the past 2160 hour(s)).

## 2016-09-02 NOTE — Assessment & Plan Note (Signed)
Symptoms mild today.  Told to use LAMISIL AND ANTIFUNGAL POWDER>

## 2016-09-02 NOTE — Patient Instructions (Signed)
Goal pound 174--> gets you in the "overwt range."

## 2016-09-15 ENCOUNTER — Other Ambulatory Visit: Payer: Self-pay | Admitting: Family Medicine

## 2017-03-04 ENCOUNTER — Other Ambulatory Visit: Payer: Self-pay

## 2017-03-04 ENCOUNTER — Ambulatory Visit: Payer: 59 | Admitting: Family Medicine

## 2017-03-04 ENCOUNTER — Telehealth: Payer: Self-pay | Admitting: Family Medicine

## 2017-03-04 DIAGNOSIS — F4323 Adjustment disorder with mixed anxiety and depressed mood: Secondary | ICD-10-CM

## 2017-03-04 MED ORDER — ESCITALOPRAM OXALATE 10 MG PO TABS: 10.0000 mg | ORAL_TABLET | Freq: Every day | ORAL | 1 refills | Status: DC

## 2017-03-04 NOTE — Telephone Encounter (Signed)
Patient has 2 OV cancelled due to provider being out sick  12/12 & 12/14-- Patient's Rx refill were pending on OV (she is now out of medication and really having high anxiety)---- Patient rescheduled for 12/27 but is requesting refill of enough Rx till OV ---Please call patient with response.    Rx refill request for:  escitalopram (LEXAPRO) 10 MG tablet [[id number]]  Order Details  Dose: 10 mg Route: Oral Frequency: Daily  Dispense Quantity: 90 tablet Refills: 1 Fills remaining: --         Sig: Take 1 tablet (10 mg total) by mouth daily.     Patient uses :  Pharmacy:  [facility] [place] - [address] DEA #:  --   --glh

## 2017-03-04 NOTE — Telephone Encounter (Signed)
Refill request for Lexapro, reviewed chart and sent in medication.  Patient has appointment for 03/09/2017. MPulliam, CMA/RT(R)

## 2017-03-09 ENCOUNTER — Ambulatory Visit: Payer: Self-pay | Admitting: Family Medicine

## 2017-03-17 ENCOUNTER — Encounter: Payer: Self-pay | Admitting: Family Medicine

## 2017-03-17 ENCOUNTER — Ambulatory Visit (INDEPENDENT_AMBULATORY_CARE_PROVIDER_SITE_OTHER): Payer: 59 | Admitting: Family Medicine

## 2017-03-17 ENCOUNTER — Other Ambulatory Visit (HOSPITAL_COMMUNITY)
Admission: RE | Admit: 2017-03-17 | Discharge: 2017-03-17 | Disposition: A | Discharge status: Home / Self Care | Payer: 59 | Source: Ambulatory Visit | Attending: Family Medicine | Admitting: Family Medicine

## 2017-03-17 VITALS — BP 133/85 | HR 97 | Temp 98.1°F | Resp 16 | Ht 64.0 in | Wt 242.1 lb

## 2017-03-17 DIAGNOSIS — D508 Other iron deficiency anemias: Secondary | ICD-10-CM | POA: Insufficient documentation

## 2017-03-17 DIAGNOSIS — F418 Other specified anxiety disorders: Secondary | ICD-10-CM

## 2017-03-17 DIAGNOSIS — Z0001 Encounter for general adult medical examination with abnormal findings: Secondary | ICD-10-CM

## 2017-03-17 DIAGNOSIS — Z833 Family history of diabetes mellitus: Secondary | ICD-10-CM | POA: Insufficient documentation

## 2017-03-17 DIAGNOSIS — Z8041 Family history of malignant neoplasm of ovary: Secondary | ICD-10-CM | POA: Insufficient documentation

## 2017-03-17 DIAGNOSIS — Z1151 Encounter for screening for human papillomavirus (HPV): Secondary | ICD-10-CM | POA: Diagnosis not present

## 2017-03-17 DIAGNOSIS — Z79899 Other long term (current) drug therapy: Secondary | ICD-10-CM | POA: Diagnosis not present

## 2017-03-17 DIAGNOSIS — Z124 Encounter for screening for malignant neoplasm of cervix: Secondary | ICD-10-CM | POA: Insufficient documentation

## 2017-03-17 DIAGNOSIS — F4323 Adjustment disorder with mixed anxiety and depressed mood: Secondary | ICD-10-CM | POA: Diagnosis not present

## 2017-03-17 DIAGNOSIS — Z6836 Body mass index (BMI) 36.0-36.9, adult: Secondary | ICD-10-CM

## 2017-03-17 DIAGNOSIS — Z8639 Personal history of other endocrine, nutritional and metabolic disease: Secondary | ICD-10-CM | POA: Diagnosis not present

## 2017-03-17 DIAGNOSIS — E781 Pure hyperglyceridemia: Secondary | ICD-10-CM | POA: Diagnosis not present

## 2017-03-17 NOTE — Progress Notes (Signed)
Impression and Recommendations:    1. Encounter for general adult medical examination with abnormal findings   2. Papanicolaou smear for cervical cancer screening   3. Family history of ovarian cancer- PGM      4. BMI 36.0-36.9,adult   5. Adjustment disorder with mixed anxiety >er depressed mood   6. Situational anxiety   7. Other iron deficiency anemia   8. Hypertriglyceridemia   9. History of hyperlipidemia     -Patient was given refills for her anxiety medications with 90+1 refill.  She states she is doing well we will keep her on the same dose. -For her panic disorder: Patient has Xanax to use as needed.  She is only been needing one every month or less.  Doing well.  We discussed stress management strategies briefly. -For her weight patient will follow-up in the near future, in January, when she is ready to take action and make changes to her diet.  She done keto in the past which has worked well and will consider doing that again.  We will see her in a couple of weeks.   Please see orders section below for further details of actions taken during this office visit.  Gross side effects, risk and benefits, and alternatives of medications discussed with patient.  Patient is aware that all medications have potential side effects and we are unable to predict every side effect or drug-drug interaction that may occur.  Expresses verbal understanding and consents to current therapy plan and treatment regiment.  1) Anticipatory Guidance: Discussed importance of wearing a seatbelt while driving, not texting while driving; sunscreen when outside along with yearly skin surveillance; eating a well balanced and modest diet; physical activity at least 25 minutes per day or 150 min/ week of moderate to intense activity.  2) Immunizations / Screenings / Labs:  All immunizations and screenings that patient agrees to, are up-to-date per recommendations or will be updated today.  Patient  understands the needs for q 57mo dental and yearly vision screens which pt will schedule independently. Obtain CBC, CMP, HgA1c, Lipid panel, TSH and vit D when fasting if not already done recently.   3) Weight:   Discussed goal of losing even 5-10% of current body weight which would improve overall feelings of well being and improve objective health data significantly.  Improve nutrient density of diet through increasing intake of fruits and vegetables and decreasing saturated/trans fats, white flour products and refined sugar products. Discussed and advised with the patient regarding weight loss methods including calorie counting using apps such as Lose it!    F-up preventative CPE in 1 year and follow up in the new year for a weight loss strategy discussion. F/up sooner for chronic care management as discussed and/or prn.     Orders Placed This Encounter  Procedures  . CBC with Differential/Platelet  . Comprehensive metabolic panel  . Hemoglobin A1c  . Lipid panel  . T4, free  . TSH  . VITAMIN D 25 Hydroxy (Vit-D Deficiency, Fractures)   Please see orders placed and AVS handed out to patient at the end of our visit for further patient instructions/ counseling done pertaining to today's office visit.  This document serves as a record of services personally performed by Thomasene Loteborah Raenette Sakata, DO. It was created on her behalf by Chestine SporeSoijett Blue, a trained medical scribe. The creation of this record is based on the scribe's personal observations and the provider's statements to them.   I have reviewed the  above documentation for accuracy and completeness, and I agree with the above.   Thomasene LotDeborah Brieanna Nau 03/17/17 1:13 PM    Subjective:    Chief Complaint  Patient presents with  . Annual Exam   CC: f/up anxiety, panic, obesity.  Also here for yearly physical in addition.  HPI: [name] [name] is a 31 y.o. female who [facility] a [facility]  health maintenance exam.   Health Maintenance Summary Reviewed and updated, unless pt declines services.  GAD: Well controlled on Lexapro current dose. She reports that she is on Lexapro without any acute issues.   Panic Attack:  Well controlled. Patient only uses xanax once a month.   Obesity:  She reports that she doesn't have time to exercise or eat healthy due to external life stressors. She was 204 lbs at her last visit (09/02/2016) and she is 242 lbs today. She used to eat a keto diet and exercise regularly prior to the recent external life stressors.   Health Maintenance: She is going for an eye exam every year. She goes to the dentist every 6 months.  Patient reports that her last pap smear was in 2015 [facility] with Dr. Bonney AidStaebler. She denies a pap smear with a positive HPV finding. She doesn't have any new sexual partners. She notes issues with her menstrual cycle since her recent weight gain. Denies family hx of breast or colon cancer. She notes that her paternal grandmother had ovarian cancer and she denies her siblings having ovarian cancer. She denies bowel issues or reflux. She denies urinary symptoms.     Filed Weights   03/17/17 1013  Weight: 242 lb 1.6 oz (109.8 kg)    Immunization History  Administered Date(s) Administered  . Tdap 03/08/2016    Health Maintenance  Topic Date Due  . PAP SMEAR  09/02/2017 (Originally 11/16/2004)  . INFLUENZA VACCINE  11/17/2017 (Originally 10/20/2016)  . HIV Screening  09/02/2028 (Originally 11/17/1998)  . TETANUS/TDAP  03/08/2026     Wt Readings from Last 3 Encounters:  03/17/17 242 lb 1.6 oz (109.8 kg)  09/02/16 204 lb 8 oz (92.8 kg)  03/08/16 222 lb 3.2 oz (100.8 kg)   BP Readings from Last 3 Encounters:  03/17/17 133/85  09/02/16 120/82  03/08/16 130/82   Pulse Readings from Last 3 Encounters:  03/17/17 97  09/02/16 83  03/08/16 85     History reviewed. No pertinent past medical  history.    Past Surgical History:  Procedure Laterality Date  . CESAREAN SECTION    . TUBAL LIGATION        Family History  Problem Relation Age of Onset  . Cancer Maternal Grandmother [id number]       ovarian  . Diabetes Maternal Grandmother [id number]      Social History   Substance and Sexual Activity  Drug Use No  ,   Social History   Substance and Sexual Activity  Alcohol Use No  ,   Social History   Tobacco Use  Smoking Status Never Smoker  Smokeless Tobacco Never Used  ,   Social History   Substance and Sexual Activity  Sexual Activity Yes  . Birth control/protection: Surgical    Current Outpatient Medications on File Prior to Visit  Medication Sig Dispense Refill  . Cholecalciferol (VITAMIN D3) 5000 units CAPS Take 1 capsule by mouth daily.    . clonazePAM (KLONOPIN) 0.5 MG tablet Take 1 tablet (0.5  mg total) by mouth as needed for anxiety. 30 tablet 0  . escitalopram (LEXAPRO) 10 MG tablet Take 1 tablet (10 mg total) by mouth daily. 90 tablet 1  . Ferrous Gluconate (IRON) 240 (27 Fe) MG TABS Take 1 tablet by mouth daily.    Marland Kitchen loratadine (CLARITIN) 10 MG tablet Take 10 mg by mouth daily.    . Multiple Vitamin (MULTIVITAMIN) tablet Take 1 tablet by mouth daily.    Marland Kitchen UNABLE TO FIND Take 25 mg by mouth daily. Vegan Iron     No current facility-administered medications on file prior to visit.     Allergies: Patient has no known allergies.  Review of Systems: General:   Denies fever, chills, unexplained weight loss.  Optho/Auditory:   Denies visual changes, blurred vision/LOV Respiratory:   Denies SOB, DOE more than baseline levels.  Cardiovascular:   Denies chest pain, palpitations, new onset peripheral edema  Gastrointestinal:   Denies nausea, vomiting, diarrhea.  Genitourinary: Denies dysuria, freq/ urgency, flank pain or discharge from genitals.  Endocrine:     Denies hot or cold intolerance, polyuria, polydipsia. Musculoskeletal:   Denies unexplained  myalgias, joint swelling, unexplained arthralgias, gait problems.  Skin:  Denies rash, suspicious lesions Neurological:     Denies dizziness, unexplained weakness, numbness  Psychiatric/Behavioral:   Denies mood changes, suicidal or homicidal ideations, hallucinations    Objective:    Blood pressure 133/85, pulse 97, temperature 98.1 F (36.7 C), temperature source Oral, resp. rate 16, height 5\' 4"  (1.626 m), weight 242 lb 1.6 oz (109.8 kg), last menstrual period 01/26/2017, SpO2 98 %. Body mass index is 41.56 kg/m. General Appearance:    Alert, cooperative, no distress, appears stated age  Head:    Normocephalic, without obvious abnormality, atraumatic  Eyes:    PERRL, conjunctiva/corneas clear, EOM's intact, fundi    benign, both eyes  Ears:    Normal TM's and external ear canals, both ears  Nose:   Nares normal, septum midline, mucosa normal, no drainage    or sinus tenderness  Throat:   Lips w/o lesion, mucosa moist, and tongue normal; teeth and   gums normal  Neck:   Supple, symmetrical, trachea midline, no adenopathy;    thyroid:  no enlargement/tenderness/nodules; no carotid   bruit or JVD  Back:     Symmetric, no curvature, ROM normal, no CVA tenderness  Lungs:     Clear to auscultation bilaterally, respirations unlabored, no       Wh/ R/ R  Chest Wall:    No tenderness or gross deformity; normal excursion   Heart:    Regular rate and rhythm, S1 and S2 normal, no murmur, rub   or gallop  Breast Exam:    deferred  Abdomen:     Soft, non-tender, bowel sounds active all four quadrants, NO   G/R/R, no masses, no organomegaly  Genitalia:    Ext genitalia: without lesion, no rash or discharge, No         tenderness;  Cervix: WNL's w/o discharge or lesion;        Adnexa:  No tenderness or palpable masses   Rectal:    deferred  Extremities:   Extremities normal, atraumatic, no cyanosis or gross edema  Pulses:   2+ and symmetric all extremities  Skin:   Warm, dry, Skin color,  texture, turgor normal, no obvious rashes or lesions Psych: No HI/SI, judgement and insight good, Euthymic mood. Full Affect.  Neurologic:   CNII-XII intact, normal strength, sensation  and reflexes    Throughout

## 2017-03-17 NOTE — Patient Instructions (Addendum)
 Preventive Care for Adults, Female  A healthy lifestyle and preventive care can promote health and wellness. Preventive health guidelines for women include the following key practices.   A routine yearly physical is a good way to check with your health care provider about your health and preventive screening. It is a chance to share any concerns and updates on your health and to receive a thorough exam.   Visit your dentist for a routine exam and preventive care every 6 months. Brush your teeth twice a day and floss once a day. Good oral hygiene prevents tooth decay and gum disease.   The frequency of eye exams is based on your age, health, family medical history, use of contact lenses, and other factors. Follow your health care provider's recommendations for frequency of eye exams.   Eat a healthy diet. Foods like vegetables, fruits, whole grains, low-fat dairy products, and lean protein foods contain the nutrients you need without too many calories. Decrease your intake of foods high in solid fats, added sugars, and salt. Eat the right amount of calories for you.Get information about a proper diet from your health care provider, if necessary.   Regular physical exercise is one of the most important things you can do for your health. Most adults should get at least 150 minutes of moderate-intensity exercise (any activity that increases your heart rate and causes you to sweat) each week. In addition, most adults need muscle-strengthening exercises on 2 or more days a week.   Maintain a healthy weight. The body mass index (BMI) is a screening tool to identify possible weight problems. It provides an estimate of body fat based on height and weight. Your health care provider can find your BMI, and can help you achieve or maintain a healthy weight.For adults 20 years and older:   - A BMI below 18.5 is considered underweight.   - A BMI of 18.5 to 24.9 is normal.   - A BMI of 25 to 29.9 is  considered overweight.   - A BMI of 30 and above is considered obese.   Maintain normal blood lipids and cholesterol levels by exercising and minimizing your intake of trans and saturated fats.  Eat a balanced diet with plenty of fruit and vegetables. Blood tests for lipids and cholesterol should begin at age 20 and be repeated every 5 years minimum.  If your lipid or cholesterol levels are high, you are over 40, or you are at high risk for heart disease, you may need your cholesterol levels checked more frequently.Ongoing high lipid and cholesterol levels should be treated with medicines if diet and exercise are not working.   If you smoke, find out from your health care provider how to quit. If you do not use tobacco, do not start.   Lung cancer screening is recommended for adults aged 55-80 years who are at high risk for developing lung cancer because of a history of smoking. A yearly low-dose CT scan of the lungs is recommended for people who have at least a 30-pack-year history of smoking and are a current smoker or have quit within the past 15 years. A pack year of smoking is smoking an average of 1 pack of cigarettes a day for 1 year (for example: 1 pack a day for 30 years or 2 packs a day for 15 years). Yearly screening should continue until the smoker has stopped smoking for at least 15 years. Yearly screening should be stopped for people who develop   a health problem that would prevent them from having lung cancer treatment.   If you are pregnant, do not drink alcohol. If you are breastfeeding, be very cautious about drinking alcohol. If you are not pregnant and choose to drink alcohol, do not have more than 1 drink per day. One drink is considered to be 12 ounces (355 mL) of beer, 5 ounces (148 mL) of wine, or 1.5 ounces (44 mL) of liquor.   Avoid use of street drugs. Do not share needles with anyone. Ask for help if you need support or instructions about stopping the use of  drugs.   High blood pressure causes heart disease and increases the risk of stroke. Your blood pressure should be checked at least yearly.  Ongoing high blood pressure should be treated with medicines if weight loss and exercise do not work.   If you are 55-79 years old, ask your health care provider if you should take aspirin to prevent strokes.   Diabetes screening involves taking a blood sample to check your fasting blood sugar level. This should be done once every 3 years, after age 45, if you are within normal weight and without risk factors for diabetes. Testing should be considered at a younger age or be carried out more frequently if you are overweight and have at least 1 risk factor for diabetes.   Breast cancer screening is essential preventive care for women. You should practice "breast self-awareness."  This means understanding the normal appearance and feel of your breasts and may include breast self-examination.  Any changes detected, no matter how small, should be reported to a health care provider.  Women in their 20s and 30s should have a clinical breast exam (CBE) by a health care provider as part of a regular health exam every 1 to 3 years.  After age 40, women should have a CBE every year.  Starting at age 40, women should consider having a mammogram (breast X-ray test) every year.  Women who have a family history of breast cancer should talk to their health care provider about genetic screening.  Women at a high risk of breast cancer should talk to their health care providers about having an MRI and a mammogram every year.   -Breast cancer gene (BRCA)-related cancer risk assessment is recommended for women who have family members with BRCA-related cancers. BRCA-related cancers include breast, ovarian, tubal, and peritoneal cancers. Having family members with these cancers may be associated with an increased risk for harmful changes (mutations) in the breast cancer genes BRCA1 and  BRCA2. Results of the assessment will determine the need for genetic counseling and BRCA1 and BRCA2 testing.   The Pap test is a screening test for cervical cancer. A Pap test can show cell changes on the cervix that might become cervical cancer if left untreated. A Pap test is a procedure in which cells are obtained and examined from the lower end of the uterus (cervix).   - Women should have a Pap test starting at age 21.   - Between ages 21 and 29, Pap tests should be repeated every 2 years.   - Beginning at age 30, you should have a Pap test every 3 years as long as the past 3 Pap tests have been normal.   - Some women have medical problems that increase the chance of getting cervical cancer. Talk to your health care provider about these problems. It is especially important to talk to your health care provider if   a new problem develops soon after your last Pap test. In these cases, your health care provider may recommend more frequent screening and Pap tests.   - The above recommendations are the same for women who have or have not gotten the vaccine for human papillomavirus (HPV).   - If you had a hysterectomy for a problem that was not cancer or a condition that could lead to cancer, then you no longer need Pap tests. Even if you no longer need a Pap test, a regular exam is a good idea to make sure no other problems are starting.   - If you are between ages 65 and 70 years, and you have had normal Pap tests going back 10 years, you no longer need Pap tests. Even if you no longer need a Pap test, a regular exam is a good idea to make sure no other problems are starting.   - If you have had past treatment for cervical cancer or a condition that could lead to cancer, you need Pap tests and screening for cancer for at least 20 years after your treatment.   - If Pap tests have been discontinued, risk factors (such as a new sexual partner) need to be reassessed to determine if screening should  be resumed.   - The HPV test is an additional test that may be used for cervical cancer screening. The HPV test looks for the virus that can cause the cell changes on the cervix. The cells collected during the Pap test can be tested for HPV. The HPV test could be used to screen women aged 30 years and older, and should be used in women of any age who have unclear Pap test results. After the age of 30, women should have HPV testing at the same frequency as a Pap test.   Colorectal cancer can be detected and often prevented. Most routine colorectal cancer screening begins at the age of 50 years and continues through age 75 years. However, your health care provider may recommend screening at an earlier age if you have risk factors for colon cancer. On a yearly basis, your health care provider may provide home test kits to check for hidden blood in the stool.  Use of a small camera at the end of a tube, to directly examine the colon (sigmoidoscopy or colonoscopy), can detect the earliest forms of colorectal cancer. Talk to your health care provider about this at age 50, when routine screening begins. Direct exam of the colon should be repeated every 5 -10 years through age 75 years, unless early forms of pre-cancerous polyps or small growths are found.   People who are at an increased risk for hepatitis B should be screened for this virus. You are considered at high risk for hepatitis B if:  -You were born in a country where hepatitis B occurs often. Talk with your health care provider about which countries are considered high risk.  - Your parents were born in a high-risk country and you have not received a shot to protect against hepatitis B (hepatitis B vaccine).  - You have HIV or AIDS.  - You use needles to inject street drugs.  - You live with, or have sex with, someone who has Hepatitis B.  - You get hemodialysis treatment.  - You take certain medicines for conditions like cancer, organ  transplantation, and autoimmune conditions.   Hepatitis C blood testing is recommended for all people born from 1945 through 1965 and any   individual with known risks for hepatitis C.   Practice safe sex. Use condoms and avoid high-risk sexual practices to reduce the spread of sexually transmitted infections (STIs). STIs include gonorrhea, chlamydia, syphilis, trichomonas, herpes, HPV, and human immunodeficiency virus (HIV). Herpes, HIV, and HPV are viral illnesses that have no cure. They can result in disability, cancer, and death. Sexually active women aged 25 years and younger should be checked for chlamydia. Older women with new or multiple partners should also be tested for chlamydia. Testing for other STIs is recommended if you are sexually active and at increased risk.   Osteoporosis is a disease in which the bones lose minerals and strength with aging. This can result in serious bone fractures or breaks. The risk of osteoporosis can be identified using a bone density scan. Women ages 65 years and over and women at risk for fractures or osteoporosis should discuss screening with their health care providers. Ask your health care provider whether you should take a calcium supplement or vitamin D to There are also several preventive steps women can take to avoid osteoporosis and resulting fractures or to keep osteoporosis from worsening. -->Recommendations include:  Eat a balanced diet high in fruits, vegetables, calcium, and vitamins.  Get enough calcium. The recommended total intake of is 1,200 mg daily; for best absorption, if taking supplements, divide doses into 250-500 mg doses throughout the day. Of the two types of calcium, calcium carbonate is best absorbed when taken with food but calcium citrate can be taken on an empty stomach.  Get enough vitamin D. NAMS and the National Osteoporosis Foundation recommend at least 1,000 IU per day for women age 50 and over who are at risk of vitamin D  deficiency. Vitamin D deficiency can be caused by inadequate sun exposure (for example, those who live in northern latitudes).  Avoid alcohol and smoking. Heavy alcohol intake (more than 7 drinks per week) increases the risk of falls and hip fracture and women smokers tend to lose bone more rapidly and have lower bone mass than nonsmokers. Stopping smoking is one of the most important changes women can make to improve their health and decrease risk for disease.  Be physically active every day. Weight-bearing exercise (for example, fast walking, hiking, jogging, and weight training) may strengthen bones or slow the rate of bone loss that comes with aging. Balancing and muscle-strengthening exercises can reduce the risk of falling and fracture.  Consider therapeutic medications. Currently, several types of effective drugs are available. Healthcare providers can recommend the type most appropriate for each woman.  Eliminate environmental factors that may contribute to accidents. Falls cause nearly 90% of all osteoporotic fractures, so reducing this risk is an important bone-health strategy. Measures include ample lighting, removing obstructions to walking, using nonskid rugs on floors, and placing mats and/or grab bars in showers.  Be aware of medication side effects. Some common medicines make bones weaker. These include a type of steroid drug called glucocorticoids used for arthritis and asthma, some antiseizure drugs, certain sleeping pills, treatments for endometriosis, and some cancer drugs. An overactive thyroid gland or using too much thyroid hormone for an underactive thyroid can also be a problem. If you are taking these medicines, talk to your doctor about what you can do to help protect your bones.reduce the rate of osteoporosis.    Menopause can be associated with physical symptoms and risks. Hormone replacement therapy is available to decrease symptoms and risks. You should talk to your  health care   provider about whether hormone replacement therapy is right for you.   Use sunscreen. Apply sunscreen liberally and repeatedly throughout the day. You should seek shade when your shadow is shorter than you. Protect yourself by wearing long sleeves, pants, a wide-brimmed hat, and sunglasses year round, whenever you are outdoors.   Once a month, do a whole body skin exam, using a mirror to look at the skin on your back. Tell your health care provider of new moles, moles that have irregular borders, moles that are larger than a pencil eraser, or moles that have changed in shape or color.   -Stay current with required vaccines (immunizations).   Influenza vaccine. All adults should be immunized every year.  Tetanus, diphtheria, and acellular pertussis (Td, Tdap) vaccine. Pregnant women should receive 1 dose of Tdap vaccine during each pregnancy. The dose should be obtained regardless of the length of time since the last dose. Immunization is preferred during the 27th 36th week of gestation. An adult who has not previously received Tdap or who does not know her vaccine status should receive 1 dose of Tdap. This initial dose should be followed by tetanus and diphtheria toxoids (Td) booster doses every 10 years. Adults with an unknown or incomplete history of completing a 3-dose immunization series with Td-containing vaccines should begin or complete a primary immunization series including a Tdap dose. Adults should receive a Td booster every 10 years.  Varicella vaccine. An adult without evidence of immunity to varicella should receive 2 doses or a second dose if she has previously received 1 dose. Pregnant females who do not have evidence of immunity should receive the first dose after pregnancy. This first dose should be obtained before leaving the health care facility. The second dose should be obtained 4 8 weeks after the first dose.  Human papillomavirus (HPV) vaccine. Females aged 73 26  years who have not received the vaccine previously should obtain the 3-dose series. The vaccine is not recommended for use in pregnant females. However, pregnancy testing is not needed before receiving a dose. If a female is found to be pregnant after receiving a dose, no treatment is needed. In that case, the remaining doses should be delayed until after the pregnancy. Immunization is recommended for any person with an immunocompromised condition through the age of 35 years if she did not get any or all doses earlier. During the 3-dose series, the second dose should be obtained 4 8 weeks after the first dose. The third dose should be obtained 24 weeks after the first dose and 16 weeks after the second dose.  Zoster vaccine. One dose is recommended for adults aged 27 years or older unless certain conditions are present.  Measles, mumps, and rubella (MMR) vaccine. Adults born before 10 generally are considered immune to measles and mumps. Adults born in [birth year] or later should have 1 or more doses of MMR vaccine unless there is a contraindication to the vaccine or there is laboratory evidence of immunity to each of the three diseases. A routine second dose of MMR vaccine should be obtained at least 28 days after the first dose for students attending postsecondary schools, health care workers, or international travelers. People who received inactivated measles vaccine or an unknown type of measles vaccine during 1963 1967 should receive 2 doses of MMR vaccine. People who received inactivated mumps vaccine or an unknown type of mumps vaccine before 1979 and are at high risk for mumps infection should consider immunization with 2 doses  of MMR vaccine. For females of childbearing age, rubella immunity should be determined. If there is no evidence of immunity, females who are not pregnant should be vaccinated. If there is no evidence of immunity, females who are pregnant should delay immunization until after pregnancy.  Unvaccinated health care workers born before [birth year] who lack laboratory evidence of measles, mumps, or rubella immunity or laboratory confirmation of disease should consider measles and mumps immunization with 2 doses of MMR vaccine or rubella immunization with 1 dose of MMR vaccine.  Pneumococcal 13-valent conjugate (PCV13) vaccine. When indicated, a person who is uncertain of her immunization history and has no record of immunization should receive the PCV13 vaccine. An adult aged 23 years or older who has certain medical conditions and has not been previously immunized should receive 1 dose of PCV13 vaccine. This PCV13 should be followed with a dose of pneumococcal polysaccharide (PPSV23) vaccine. The PPSV23 vaccine dose should be obtained at least 8 weeks after the dose of PCV13 vaccine. An adult aged 47 years or older who has certain medical conditions and previously received 1 or more doses of PPSV23 vaccine should receive 1 dose of PCV13. The PCV13 vaccine dose should be obtained 1 or more years after the last PPSV23 vaccine dose.  Pneumococcal polysaccharide (PPSV23) vaccine. When PCV13 is also indicated, PCV13 should be obtained first. All adults aged 14 years and older should be immunized. An adult younger than age 50 years who has certain medical conditions should be immunized. Any person who resides in a nursing home or long-term care facility should be immunized. An adult smoker should be immunized. People with an immunocompromised condition and certain other conditions should receive both PCV13 and PPSV23 vaccines. People with human immunodeficiency virus (HIV) infection should be immunized as soon as possible after diagnosis. Immunization during chemotherapy or radiation therapy should be avoided. Routine use of PPSV23 vaccine is not recommended for American Indians, [place] Natives, or people younger than 65 years unless there are medical conditions that require PPSV23 vaccine. When indicated,  people who have unknown immunization and have no record of immunization should receive PPSV23 vaccine. One-time revaccination 5 years after the first dose of PPSV23 is recommended for people aged 67 64 years who have chronic kidney failure, nephrotic syndrome, asplenia, or immunocompromised conditions. People who received 1 2 doses of PPSV23 before age 44 years should receive another dose of PPSV23 vaccine at age 4 years or later if at least 5 years have passed since the previous dose. Doses of PPSV23 are not needed for people immunized with PPSV23 at or after age 9 years.  Meningococcal vaccine. Adults with asplenia or persistent complement component deficiencies should receive 2 doses of quadrivalent meningococcal conjugate (MenACWY-D) vaccine. The doses should be obtained at least 2 months apart. Microbiologists working with certain meningococcal bacteria, [place] recruits, people at risk during an outbreak, and people who travel to or live in countries with a high rate of meningitis should be immunized. A first-year college student up through age 14 years who is living in a residence hall should receive a dose if she did not receive a dose on or after her 16th birthday. Adults who have certain high-risk conditions should receive one or more doses of vaccine.  Hepatitis A vaccine. Adults who wish to be protected from this disease, have certain high-risk conditions, work with hepatitis A-infected animals, work in hepatitis A research labs, or travel to or work in countries with a high rate of hepatitis A should  be immunized. Adults who were previously unvaccinated and who anticipate close contact with an international adoptee during the first 60 days after arrival in the [place] from a country with a high rate of hepatitis A should be immunized.  Hepatitis B vaccine.  Adults who wish to be protected from this disease, have certain high-risk conditions, may be exposed to blood or other infectious  body fluids, are household contacts or sex partners of hepatitis B positive people, are clients or workers in certain care facilities, or travel to or work in countries with a high rate of hepatitis B should be immunized.  Haemophilus influenzae type b (Hib) vaccine. A previously unvaccinated person with asplenia or sickle cell disease or having a scheduled splenectomy should receive 1 dose of Hib vaccine. Regardless of previous immunization, a recipient of a hematopoietic stem cell transplant should receive a 3-dose series 6 12 months after her successful transplant. Hib vaccine is not recommended for adults with HIV infection.  Preventive Services / Frequency Ages 64 to 39years  Blood pressure check.** / Every 1 to 2 years.  Lipid and cholesterol check.** / Every 5 years beginning at age 16.  Clinical breast exam.** / Every 3 years for women in their 31s and 66s.  BRCA-related cancer risk assessment.** / For women who have family members with a BRCA-related cancer (breast, ovarian, tubal, or peritoneal cancers).  Pap test.** / Every 2 years from ages 44 through 96. Every 3 years starting at age 36 through age 79 or 66 with a history of 3 consecutive normal Pap tests.  HPV screening.** / Every 3 years from ages 8 through ages 45 to 27 with a history of 3 consecutive normal Pap tests.  Hepatitis C blood test.** / For any individual with known risks for hepatitis C.  Skin self-exam. / Monthly.  Influenza vaccine. / Every year.  Tetanus, diphtheria, and acellular pertussis (Tdap, Td) vaccine.** / Consult your health care provider. Pregnant women should receive 1 dose of Tdap vaccine during each pregnancy. 1 dose of Td every 10 years.  Varicella vaccine.** / Consult your health care provider. Pregnant females who do not have evidence of immunity should receive the first dose after pregnancy.  HPV vaccine. / 3 doses over 6 months, if 20 and younger. The vaccine is not recommended for use in  pregnant females. However, pregnancy testing is not needed before receiving a dose.  Measles, mumps, rubella (MMR) vaccine.** / You need at least 1 dose of MMR if you were born in 1957 or later. You may also need a 2nd dose. For females of childbearing age, rubella immunity should be determined. If there is no evidence of immunity, females who are not pregnant should be vaccinated. If there is no evidence of immunity, females who are pregnant should delay immunization until after pregnancy.  Pneumococcal 13-valent conjugate (PCV13) vaccine.** / Consult your health care provider.  Pneumococcal polysaccharide (PPSV23) vaccine.** / 1 to 2 doses if you smoke cigarettes or if you have certain conditions.  Meningococcal vaccine.** / 1 dose if you are age 49 to 79 years and a Market researcher living in a residence hall, or have one of several medical conditions, you need to get vaccinated against meningococcal disease. You may also need additional booster doses.  Hepatitis A vaccine.** / Consult your health care provider.  Hepatitis B vaccine.** / Consult your health care provider.  Haemophilus influenzae type b (Hib) vaccine.** / Consult your health care provider.  Ages 25 to  64years  Blood pressure check.** / Every 1 to 2 years.  Lipid and cholesterol check.** / Every 5 years beginning at age 20 years.  Lung cancer screening. / Every year if you are aged 55 80 years and have a 30-pack-year history of smoking and currently smoke or have quit within the past 15 years. Yearly screening is stopped once you have quit smoking for at least 15 years or develop a health problem that would prevent you from having lung cancer treatment.  Clinical breast exam.** / Every year after age 40 years.  BRCA-related cancer risk assessment.** / For women who have family members with a BRCA-related cancer (breast, ovarian, tubal, or peritoneal cancers).  Mammogram.** / Every year beginning at age 40  years and continuing for as long as you are in good health. Consult with your health care provider.  Pap test.** / Every 3 years starting at age 30 years through age 65 or 70 years with a history of 3 consecutive normal Pap tests.  HPV screening.** / Every 3 years from ages 30 years through ages 65 to 70 years with a history of 3 consecutive normal Pap tests.  Fecal occult blood test (FOBT) of stool. / Every year beginning at age 50 years and continuing until age 75 years. You may not need to do this test if you get a colonoscopy every 10 years.  Flexible sigmoidoscopy or colonoscopy.** / Every 5 years for a flexible sigmoidoscopy or every 10 years for a colonoscopy beginning at age 50 years and continuing until age 75 years.  Hepatitis C blood test.** / For all people born from 1945 through 1965 and any individual with known risks for hepatitis C.  Skin self-exam. / Monthly.  Influenza vaccine. / Every year.  Tetanus, diphtheria, and acellular pertussis (Tdap/Td) vaccine.** / Consult your health care provider. Pregnant women should receive 1 dose of Tdap vaccine during each pregnancy. 1 dose of Td every 10 years.  Varicella vaccine.** / Consult your health care provider. Pregnant females who do not have evidence of immunity should receive the first dose after pregnancy.  Zoster vaccine.** / 1 dose for adults aged 60 years or older.  Measles, mumps, rubella (MMR) vaccine.** / You need at least 1 dose of MMR if you were born in 1957 or later. You may also need a 2nd dose. For females of childbearing age, rubella immunity should be determined. If there is no evidence of immunity, females who are not pregnant should be vaccinated. If there is no evidence of immunity, females who are pregnant should delay immunization until after pregnancy.  Pneumococcal 13-valent conjugate (PCV13) vaccine.** / Consult your health care provider.  Pneumococcal polysaccharide (PPSV23) vaccine.** / 1 to 2 doses if  you smoke cigarettes or if you have certain conditions.  Meningococcal vaccine.** / Consult your health care provider.  Hepatitis A vaccine.** / Consult your health care provider.  Hepatitis B vaccine.** / Consult your health care provider.  Haemophilus influenzae type b (Hib) vaccine.** / Consult your health care provider.  Ages 65 years and over  Blood pressure check.** / Every 1 to 2 years.  Lipid and cholesterol check.** / Every 5 years beginning at age 20 years.  Lung cancer screening. / Every year if you are aged 55 80 years and have a 30-pack-year history of smoking and currently smoke or have quit within the past 15 years. Yearly screening is stopped once you have quit smoking for at least 15 years or develop a health   problem that would prevent you from having lung cancer treatment.  Clinical breast exam.** / Every year after age 30 years.  BRCA-related cancer risk assessment.** / For women who have family members with a BRCA-related cancer (breast, ovarian, tubal, or peritoneal cancers).  Mammogram.** / Every year beginning at age 40 years and continuing for as long as you are in good health. Consult with your health care provider.  Pap test.** / Every 3 years starting at age 75 years through age 87 or 13 years with 3 consecutive normal Pap tests. Testing can be stopped between 65 and 70 years with 3 consecutive normal Pap tests and no abnormal Pap or HPV tests in the past 10 years.  HPV screening.** / Every 3 years from ages 59 years through ages 81 or 1 years with a history of 3 consecutive normal Pap tests. Testing can be stopped between 65 and 70 years with 3 consecutive normal Pap tests and no abnormal Pap or HPV tests in the past 10 years.  Fecal occult blood test (FOBT) of stool. / Every year beginning at age 21 years and continuing until age 54 years. You may not need to do this test if you get a colonoscopy every 10 years.  Flexible sigmoidoscopy or colonoscopy.** /  Every 5 years for a flexible sigmoidoscopy or every 10 years for a colonoscopy beginning at age 2 years and continuing until age 64 years.  Hepatitis C blood test.** / For all people born from [birth year] through 1967 and any individual with known risks for hepatitis C.  Osteoporosis screening.** / A one-time screening for women ages 10 years and over and women at risk for fractures or osteoporosis.  Skin self-exam. / Monthly.  Influenza vaccine. / Every year.  Tetanus, diphtheria, and acellular pertussis (Tdap/Td) vaccine.** / 1 dose of Td every 10 years.  Varicella vaccine.** / Consult your health care provider.  Zoster vaccine.** / 1 dose for adults aged 81 years or older.  Pneumococcal 13-valent conjugate (PCV13) vaccine.** / Consult your health care provider.  Pneumococcal polysaccharide (PPSV23) vaccine.** / 1 dose for all adults aged 39 years and older.  Meningococcal vaccine.** / Consult your health care provider.  Hepatitis A vaccine.** / Consult your health care provider.  Hepatitis B vaccine.** / Consult your health care provider.  Haemophilus influenzae type b (Hib) vaccine.** / Consult your health care provider. ** Family history and personal history of risk and conditions may change your health care provider's recommendations. Document Released: 05/04/2001 Document Revised: 12/27/2012  [facility] Patient Information 2014 [place], [place].   EXERCISE AND DIET:  We recommended that you start or continue a regular exercise program for good health. Regular exercise means any activity that makes your heart beat faster and makes you sweat.  We recommend exercising at least 30 minutes per day at least 3 days a week, preferably 5.  We also recommend a diet low in fat and sugar / carbohydrates.  Inactivity, poor dietary choices and obesity can cause diabetes, heart attack, stroke, and kidney damage, among others.     ALCOHOL AND SMOKING:  Women should limit their alcohol intake to no  more than 7 drinks/beers/glasses of wine (combined, not each!) per week. Moderation of alcohol intake to this level decreases your risk of breast cancer and liver damage.  ( And of course, no recreational drugs are part of a healthy lifestyle.)  Also, you should not be smoking at all or even being exposed to second hand smoke. Most people know  smoking can cause cancer, and various heart and lung diseases, but did you know it also contributes to weakening of your bones?  Aging of your skin?  Yellowing of your teeth and nails?   CALCIUM AND VITAMIN D:  Adequate intake of calcium and Vitamin D are recommended.  The recommendations for exact amounts of these supplements seem to change often, but generally speaking 600 mg of calcium (either carbonate or citrate) and 800 units of Vitamin D per day seems prudent. Certain women may benefit from higher intake of Vitamin D.  If you are among these women, your doctor will have told you during your visit.     PAP SMEARS:  Pap smears, to check for cervical cancer or precancers,  have traditionally been done yearly, although recent scientific advances have shown that most women can have pap smears less often.  However, every woman still should have a physical exam from her gynecologist or primary care physician every year. It will include a breast check, inspection of the vulva and vagina to check for abnormal growths or skin changes, a visual exam of the cervix, and then an exam to evaluate the size and shape of the uterus and ovaries.  And after 33 years of age, a rectal exam is indicated to check for rectal cancers. We will also provide age appropriate advice regarding health maintenance, like when you should have certain vaccines, screening for sexually transmitted diseases, bone density testing, colonoscopy, mammograms, etc.    MAMMOGRAMS:  All women over 40 years old should have a yearly mammogram. Many facilities now offer a "3D" mammogram, which may cost  around $50 extra out of pocket. If possible,  we recommend you accept the option to have the 3D mammogram performed.  It both reduces the number of women who will be called back for extra views which then turn out to be normal, and it is better than the routine mammogram at detecting truly abnormal areas.     COLONOSCOPY:  Colonoscopy to screen for colon cancer is recommended for all women at age 50.  We know, you hate the idea of the prep.  We agree, BUT, having colon cancer and not knowing it is worse!!  Colon cancer so often starts as a polyp that can be seen and removed at colonscopy, which can quite literally save your life!  And if your first colonoscopy is normal and you have no family history of colon cancer, most women don't have to have it again for 10 years.  Once every ten years, you can do something that may end up saving your life, right?  We will be happy to help you get it scheduled when you are ready.  Be sure to check your insurance coverage so you understand how much it will cost.  It may be covered as a preventative service at no cost, but you should check your particular policy.   

## 2017-03-18 LAB — LIPID PANEL
CHOLESTEROL TOTAL: 215 mg/dL — AB (ref 100–199)
Chol/HDL Ratio: 5.7 ratio — ABNORMAL HIGH (ref 0.0–4.4)
HDL: 38 mg/dL — AB (ref >39)
LDL Calculated: 123 mg/dL — ABNORMAL HIGH (ref 0–99)
Triglycerides: 272 mg/dL — ABNORMAL HIGH (ref 0–149)
VLDL CHOLESTEROL CAL: 54 mg/dL — AB (ref 5–40)

## 2017-03-18 LAB — COMPREHENSIVE METABOLIC PANEL
ALK PHOS: 53 IU/L (ref 39–117)
ALT: 22 IU/L (ref 0–32)
AST: 19 IU/L (ref 0–40)
Albumin/Globulin Ratio: 2 (ref 1.2–2.2)
Albumin: 4.7 g/dL (ref 3.5–5.5)
BUN/Creatinine Ratio: 14 (ref 9–23)
BUN: 8 mg/dL (ref 6–20)
Bilirubin Total: 0.2 mg/dL (ref 0.0–1.2)
CALCIUM: 9.5 mg/dL (ref 8.7–10.2)
CO2: 27 mmol/L (ref 20–29)
CREATININE: 0.56 mg/dL — AB (ref 0.57–1.00)
Chloride: 100 mmol/L (ref 96–106)
GFR calc Af Amer: 142 mL/min/{1.73_m2} (ref >59)
GFR, EST NON AFRICAN AMERICAN: 123 mL/min/{1.73_m2} (ref >59)
GLUCOSE: 93 mg/dL (ref 65–99)
Globulin, Total: 2.4 g/dL (ref 1.5–4.5)
Potassium: 4.5 mmol/L (ref 3.5–5.2)
Sodium: 139 mmol/L (ref 134–144)
Total Protein: 7.1 g/dL (ref 6.0–8.5)

## 2017-03-18 LAB — CBC WITH DIFFERENTIAL/PLATELET
BASOS ABS: 0 10*3/uL (ref 0.0–0.2)
Basos: 1 %
EOS (ABSOLUTE): 0.1 10*3/uL (ref 0.0–0.4)
EOS: 2 %
Hematocrit: 36.2 % (ref 34.0–46.6)
Hemoglobin: 12.2 g/dL (ref 11.1–15.9)
IMMATURE GRANULOCYTES: 0 %
Immature Grans (Abs): 0 10*3/uL (ref 0.0–0.1)
Lymphocytes Absolute: 2.2 10*3/uL (ref 0.7–3.1)
Lymphs: 30 %
MCH: 29.4 pg (ref 26.6–33.0)
MCHC: 33.7 g/dL (ref 31.5–35.7)
MCV: 87 fL (ref 79–97)
MONOS ABS: 0.8 10*3/uL (ref 0.1–0.9)
Monocytes: 11 %
NEUTROS PCT: 56 %
Neutrophils Absolute: 4.2 10*3/uL (ref 1.4–7.0)
PLATELETS: 307 10*3/uL (ref 150–379)
RBC: 4.15 x10E6/uL (ref 3.77–5.28)
RDW: 13.8 % (ref 12.3–15.4)
WBC: 7.3 10*3/uL (ref 3.4–10.8)

## 2017-03-18 LAB — CYTOLOGY - PAP
DIAGNOSIS: NEGATIVE
HPV (WINDOPATH): NOT DETECTED

## 2017-03-18 LAB — T4, FREE: Free T4: 1 ng/dL (ref 0.82–1.77)

## 2017-03-18 LAB — HEMOGLOBIN A1C
ESTIMATED AVERAGE GLUCOSE: 108 mg/dL
HEMOGLOBIN A1C: 5.4 % (ref 4.8–5.6)

## 2017-03-18 LAB — TSH: TSH: 2.21 u[IU]/mL (ref 0.450–4.500)

## 2017-03-18 LAB — VITAMIN D 25 HYDROXY (VIT D DEFICIENCY, FRACTURES): Vit D, 25-Hydroxy: 46.9 ng/mL (ref 30.0–100.0)

## 2017-09-02 ENCOUNTER — Telehealth: Payer: Self-pay | Admitting: Family Medicine

## 2017-09-02 NOTE — Telephone Encounter (Signed)
Patient was last seen on 03/17/2017 and per AVS was to come back for office visit in the new year for weight weight loss options.  Called and spoke to patient, she is tolerating medication well and states that it works perfectly for her.  Patient has no insurance at the moment.  Please advise if 90 day can be given until patient has insurance or can afford the OV.  MPulliam, CMA/RT(R)

## 2017-09-02 NOTE — Telephone Encounter (Signed)
Pt called to request provider call in refill on:  escitalopram (LEXAPRO) 10 MG tablet [[id number]]   Order Details  Dose: 10 mg Route: Oral Frequency: Daily  Dispense Quantity: 90 tablet Refills: 1 Fills remaining: --        Sig: Take 1 tablet (10 mg total) by mouth daily.     --- Patient husband recently loss job & she has only 1 week worth of meds left before completely out, patient requesting provider consider hardship of her situation & advised financially unable to afford an OV for med refill.  Please send Rx refill to: [facility]      [address] [place] - [phone] (Phone) [phone] (Fax)    --- Forwarding request to medical assistant & provider.  --glh

## 2017-09-05 ENCOUNTER — Other Ambulatory Visit: Payer: Self-pay

## 2017-09-05 DIAGNOSIS — F4323 Adjustment disorder with mixed anxiety and depressed mood: Secondary | ICD-10-CM

## 2017-09-05 MED ORDER — ESCITALOPRAM OXALATE 10 MG PO TABS: 10.0000 mg | ORAL_TABLET | Freq: Every day | ORAL | 0 refills | Status: DC

## 2017-09-05 NOTE — Telephone Encounter (Signed)
Okay to give patient 90 days only without refill.    Will need office visit for future refills, please put that on prescription so we remember

## 2017-09-05 NOTE — Telephone Encounter (Signed)
Refill sent in and patient notified. MPulliam, CMA/RT(R)  

## 2017-09-05 NOTE — Telephone Encounter (Signed)
Refill lexapro for 90 days per Dr. Sharee Holsterpalski. MPulliam, CMA/RT(R)

## 2017-12-01 ENCOUNTER — Other Ambulatory Visit: Payer: Self-pay

## 2017-12-01 DIAGNOSIS — F4323 Adjustment disorder with mixed anxiety and depressed mood: Secondary | ICD-10-CM

## 2017-12-01 MED ORDER — ESCITALOPRAM OXALATE 20 MG PO TABS
20.0000 mg | ORAL_TABLET | Freq: Every day | ORAL | 1 refills | Status: DC
Start: 2017-12-01 — End: 2018-05-09

## 2017-12-01 NOTE — Telephone Encounter (Signed)
Sending to Dr. Sharee Holsterpalski to add notes. MPulliam, CMA/RT(R)

## 2017-12-01 NOTE — Telephone Encounter (Signed)
Patient is requesting an increase on Lexapro.  Patient spoke with Dr. Sharee Holsterpalski and increase in dose is approved for patient at this time form 10 mg to 20 mg daily. MPulliam, CMA/RT(R)

## 2017-12-02 NOTE — Telephone Encounter (Signed)
I spoke with patient on 12/01/2017 at her mother's office visit.  [name] [name] and her family have had a job loss and hence our no longer insured medically.  She told me today emotionally she is doing well but feels she might benefit from an increase in medication dose due to the increased stress in their lives right now of a job loss from her husband.  Otherwise she denies side effects, feels is helping her very much emotionally, just feel more relaxed, less stressed and more hopeful.  We will increase Lexapro 10 mg daily to 20 mg daily.  Told CMA okay to send in 90-day supply with 1 refill. Patient knows to contact us sooner than planned follow-up office visit in 4 to 6 months if she is having any concerns, questions, side effects etc.

## 2018-04-11 DIAGNOSIS — H02822 Cysts of right lower eyelid: Secondary | ICD-10-CM | POA: Diagnosis not present

## 2018-05-09 ENCOUNTER — Encounter: Payer: Self-pay | Admitting: Family Medicine

## 2018-05-09 ENCOUNTER — Ambulatory Visit (INDEPENDENT_AMBULATORY_CARE_PROVIDER_SITE_OTHER): Payer: BLUE CROSS/BLUE SHIELD | Admitting: Family Medicine

## 2018-05-09 VITALS — BP 136/90 | HR 94 | Temp 98.1°F | Ht 64.0 in | Wt 270.5 lb

## 2018-05-09 DIAGNOSIS — F411 Generalized anxiety disorder: Secondary | ICD-10-CM | POA: Diagnosis not present

## 2018-05-09 DIAGNOSIS — F4323 Adjustment disorder with mixed anxiety and depressed mood: Secondary | ICD-10-CM

## 2018-05-09 DIAGNOSIS — F410 Panic disorder [episodic paroxysmal anxiety] without agoraphobia: Secondary | ICD-10-CM | POA: Diagnosis not present

## 2018-05-09 MED ORDER — BUSPIRONE HCL 5 MG PO TABS: 5.0000 mg | ORAL_TABLET | Freq: Three times a day (TID) | ORAL | 5 refills | Status: DC

## 2018-05-09 MED ORDER — ALPRAZOLAM 0.5 MG PO TABS: 0.2500 mg | ORAL_TABLET | ORAL | 0 refills | Status: AC | PRN

## 2018-05-09 MED ORDER — ESCITALOPRAM OXALATE 20 MG PO TABS: 20.0000 mg | ORAL_TABLET | Freq: Every day | ORAL | 1 refills | Status: DC

## 2018-05-09 NOTE — Patient Instructions (Signed)
 Generalized Anxiety Disorder, Adult  Generalized anxiety disorder (GAD) is a mental health disorder. People with this condition constantly worry about everyday events. Unlike normal anxiety, worry related to GAD is not triggered by a specific event. These worries also do not fade or get better with time. GAD interferes with life functions, including relationships, work, and school. GAD can vary from mild to severe. People with severe GAD can have intense waves of anxiety with physical symptoms (panic attacks). What are the causes? The exact cause of GAD is not known. What increases the risk? This condition is more likely to develop in:  Women.  People who have a family history of anxiety disorders.  People who are very shy.  People who experience very stressful life events, such as the death of a loved one.  People who have a very stressful family environment.  What are the signs or symptoms? People with GAD often worry excessively about many things in their lives, such as their health and family. They may also be overly concerned about:  Doing well at work.  Being on time.  Natural disasters.  Friendships.  Physical symptoms of GAD include:  Fatigue.  Muscle tension or having muscle twitches.  Trembling or feeling shaky.  Being easily startled.  Feeling like your heart is pounding or racing.  Feeling out of breath or like you cannot take a deep breath.  Having trouble falling asleep or staying asleep.  Sweating.  Nausea, diarrhea, or irritable bowel syndrome (IBS).  Headaches.  Trouble concentrating or remembering facts.  Restlessness.  Irritability.  How is this diagnosed? Your health care provider can diagnose GAD based on your symptoms and medical history. You will also have a physical exam. The health care provider will ask specific questions about your symptoms, including how severe they are, when they started, and if they come and go. Your health  care provider may ask you about your use of alcohol or drugs, including prescription medicines. Your health care provider may refer you to a mental health specialist for further evaluation. Your health care provider will do a thorough examination and may perform additional tests to rule out other possible causes of your symptoms. To be diagnosed with GAD, a person must have anxiety that:  Is out of his or her control.  Affects several different aspects of his or her life, such as work and relationships.  Causes distress that makes him or her unable to take part in normal activities.  Includes at least three physical symptoms of GAD, such as restlessness, fatigue, trouble concentrating, irritability, muscle tension, or sleep problems.  Before your health care provider can confirm a diagnosis of GAD, these symptoms must be present more days than they are not, and they must last for six months or longer. How is this treated? The following therapies are usually used to treat GAD:  Medicine. Antidepressant medicine is usually prescribed for long-term daily control. Antianxiety medicines may be added in severe cases, especially when panic attacks occur.  Talk therapy (psychotherapy). Certain types of talk therapy can be helpful in treating GAD by providing support, education, and guidance. Options include: ? Cognitive behavioral therapy (CBT). People learn coping skills and techniques to ease their anxiety. They learn to identify unrealistic or negative thoughts and behaviors and to replace them with positive ones. ? Acceptance and commitment therapy (ACT). This treatment teaches people how to be mindful as a way to cope with unwanted thoughts and feelings. ? Biofeedback. This process trains   you to manage your body's response (physiological response) through breathing techniques and relaxation methods. You will work with a therapist while machines are used to monitor your physical symptoms.  Stress  management techniques. These include yoga, meditation, and exercise.  A mental health specialist can help determine which treatment is best for you. Some people see improvement with one type of therapy. However, other people require a combination of therapies. Follow these instructions at home:  Take over-the-counter and prescription medicines only as told by your health care provider.  Try to maintain a normal routine.  Try to anticipate stressful situations and allow extra time to manage them.  Practice any stress management or self-calming techniques as taught by your health care provider.  Do not punish yourself for setbacks or for not making progress.  Try to recognize your accomplishments, even if they are small.  Keep all follow-up visits as told by your health care provider. This is important. Contact a health care provider if:  Your symptoms do not get better.  Your symptoms get worse.  You have signs of depression, such as: ? A persistently sad, cranky, or irritable mood. ? Loss of enjoyment in activities that used to bring you joy. ? Change in weight or eating. ? Changes in sleeping habits. ? Avoiding friends or family members. ? Loss of energy for normal tasks. ? Feelings of guilt or worthlessness. Get help right away if:  You have serious thoughts about hurting yourself or others. If you ever feel like you may hurt yourself or others, or have thoughts about taking your own life, get help right away. You can go to your nearest emergency department or call:  Your local emergency services (911 in the [place]  A suicide crisis helpline, such as the National Suicide Prevention Lifeline at 1-800-273-8255. This is open 24 hours a day.  Summary  Generalized anxiety disorder (GAD) is a mental health disorder that involves worry that is not triggered by a specific event.  People with GAD often worry excessively about many things in their lives, such as their health and  family.  GAD may cause physical symptoms such as restlessness, trouble concentrating, sleep problems, frequent sweating, nausea, diarrhea, headaches, and trembling or muscle twitching.  A mental health specialist can help determine which treatment is best for you. Some people see improvement with one type of therapy. However, other people require a combination of therapies. This information is not intended to replace advice given to you by your health care provider. Make sure you discuss any questions you have with your health care provider. Document Released: 07/03/2012 Document Revised: 01/27/2016 Document Reviewed: 01/27/2016 Elsevier Interactive Patient Education  2018 Else[employer] Inc[employer]      Living With Anxiety  After being diagnosed with an anxiety disorder, you may be relieved to know why you have felt or behaved a certain way. It is natural to also feel overwhelmed about the treatment ahead and what it will mean for your life. With care and support, you can manage this condition and recover from it. How to cope with anxiety Dealing with stress Stress is your body's reaction to life changes and events, both good and bad. Stress can last just a few hours or it can be ongoing. Stress can play a major role in anxiety, so it is important to learn both how to cope with stress and how to think about it differently. Talk with your health care provider or a counselor to learn more about stress reduction. He   or she may suggest some stress reduction techniques, such as:  Music therapy. This can include creating or listening to music that you enjoy and that inspires you.  Mindfulness-based meditation. This involves being aware of your normal breaths, rather than trying to control your breathing. It can be done while sitting or walking.  Centering prayer. This is a kind of meditation that involves focusing on a word, phrase, or sacred image that is meaningful to you and that brings you peace.  Deep  breathing. To do this, expand your stomach and inhale slowly through your nose. Hold your breath for 3-5 seconds. Then exhale slowly, allowing your stomach muscles to relax.  Self-talk. This is a skill where you identify thought patterns that lead to anxiety reactions and correct those thoughts.  Muscle relaxation. This involves tensing muscles then relaxing them.  Choose a stress reduction technique that fits your lifestyle and personality. Stress reduction techniques take time and practice. Set aside 5-15 minutes a day to do them. Therapists can offer training in these techniques. The training may be covered by some insurance plans. Other things you can do to manage stress include:  Keeping a stress diary. This can help you learn what triggers your stress and ways to control your response.  Thinking about how you respond to certain situations. You may not be able to control everything, but you can control your reaction.  Making time for activities that help you relax, and not feeling guilty about spending your time in this way.  Therapy combined with coping and stress-reduction skills provides the best chance for successful treatment. Medicines Medicines can help ease symptoms. Medicines for anxiety include:  Anti-anxiety drugs.  Antidepressants.  Beta-blockers.  Medicines may be used as the main treatment for anxiety disorder, along with therapy, or if other treatments are not working. Medicines should be prescribed by a health care provider. Relationships Relationships can play a big part in helping you recover. Try to spend more time connecting with trusted friends and family members. Consider going to couples counseling, taking family education classes, or going to family therapy. Therapy can help you and others better understand the condition. How to recognize changes in your condition Everyone has a different response to treatment for anxiety. Recovery from anxiety happens when  symptoms decrease and stop interfering with your daily activities at home or work. This may mean that you will start to:  Have better concentration and focus.  Sleep better.  Be less irritable.  Have more energy.  Have improved memory.  It is important to recognize when your condition is getting worse. Contact your health care provider if your symptoms interfere with home or work and you do not feel like your condition is improving. Where to find help and support: You can get help and support from these sources:  Self-help groups.  Online and community organizations.  A trusted spiritual leader.  Couples counseling.  Family education classes.  Family therapy.  Follow these instructions at home:  Eat a healthy diet that includes plenty of vegetables, fruits, whole grains, low-fat dairy products, and lean protein. Do not eat a lot of foods that are high in solid fats, added sugars, or salt.  Exercise. Most adults should do the following: ? Exercise for at least 150 minutes each week. The exercise should increase your heart rate and make you sweat (moderate-intensity exercise). ? Strengthening exercises at least twice a week.  Cut down on caffeine, tobacco, alcohol, and other potentially harmful substances.    Get the right amount and quality of sleep. Most adults need 7-9 hours of sleep each night.  Make choices that simplify your life.  Take over-the-counter and prescription medicines only as told by your health care provider.  Avoid caffeine, alcohol, and certain over-the-counter cold medicines. These may make you feel worse. Ask your pharmacist which medicines to avoid.  Keep all follow-up visits as told by your health care provider. This is important. Questions to ask your health care provider  Would I benefit from therapy?  How often should I follow up with a health care provider?  How long do I need to take medicine?  Are there any long-term side effects of my  medicine?  Are there any alternatives to taking medicine? Contact a health care provider if:  You have a hard time staying focused or finishing daily tasks.  You spend many hours a day feeling worried about everyday life.  You become exhausted by worry.  You start to have headaches, feel tense, or have nausea.  You urinate more than normal.  You have diarrhea. Get help right away if:  You have a racing heart and shortness of breath.  You have thoughts of hurting yourself or others. If you ever feel like you may hurt yourself or others, or have thoughts about taking your own life, get help right away. You can go to your nearest emergency department or call:  Your local emergency services (911 in the [place]  A suicide crisis helpline, such as the National Suicide Prevention Lifeline at 1-800-273-8255. This is open 24-hours a day.  Summary  Taking steps to deal with stress can help calm you.  Medicines cannot cure anxiety disorders, but they can help ease symptoms.  Family, friends, and partners can play a big part in helping you recover from an anxiety disorder. This information is not intended to replace advice given to you by your health care provider. Make sure you discuss any questions you have with your health care provider. Document Released: 03/02/2016 Document Revised: 03/02/2016 Document Reviewed: 03/02/2016 Elsevier Interactive Patient Education  2018 Elsevier Inc.   

## 2018-05-09 NOTE — Progress Notes (Signed)
Impression and Recommendations:    1. GAD (generalized anxiety disorder)   2. Adjustment disorder with mixed anxiety >er depressed mood   3. Panic disorder     1. Adjustment Disorder - Generalized Anxiety Disorder, Panic Disorder - Anxiety suboptimally managed at this time. - Recommended change in treatment plan today.  See med list below.  - Continue on Lexapro as prescribed. - Patient tolerating well without complication.  Denies S-E   - Begin Buspar today. - Advised patient that it will take about two months for the Buspar to kick in.  - Per patient, acute panic is suboptimally managed on Klonopin. - Xanax provided for acute panic ONLY. - Reviewed the need to use this medication sparingly for acute panic attack intervention only.  - Extensively reviewed the "spokes of the wheel" of mood and health management.  Stressed the importance of ongoing prudent habits, including regular exercise, appropriate sleep hygiene, healthful dietary habits, and prayer/meditation to relax.  - Emphasized the importance of seeking the assistance of a counselor or life coach, engaging in meditation, and reading about strategies for stress and anxiety management.  - Advised patient to look up free mindfulness and wellness meditations on Youtube.  - STRONGLY advised patient to take time for self-care and personal wellness.  2. BMI Counseling - BMI of 46.43 Explained to patient what BMI refers to, and what it means medically.  Told patient to think about it as a "medical risk stratification measurement" and how increasing BMI is associated with increasing risk/ or worsening state of various diseases such as hypertension, hyperlipidemia, diabetes, premature OA, depression etc.  American Heart Association guidelines for healthy diet, basically Mediterranean diet, and exercise guidelines of 30 minutes 5 days per week or more discussed in detail.  Health counseling performed.  All questions  answered.  - Per patient, she desires to lose weight, but is not ready to fully change her lifestyle.  - Extensively discussed meditations on mindful eating, mindfulness in general, and improved wellness.  3. Lifestyle & Preventative Health Maintenance - Advised patient to continue working toward exercising to improve overall mental, physical, and emotional health.    - Encouraged patient to engage in daily physical activity, especially a formal exercise routine.  Recommended that the patient eventually strive for at least 150 minutes of moderate cardiovascular activity per week according to guidelines established by the [facility].   - Healthy dietary habits encouraged, including low-carb, and high amounts of lean protein in diet.   - Patient should also consume adequate amounts of water.   Education and routine counseling performed. Handouts provided.   Medications Discontinued During This Encounter  Medication Reason  . Ferrous Gluconate (IRON) 240 (27 Fe) MG TABS Patient Preference  . clonazePAM (KLONOPIN) 0.5 MG tablet   . escitalopram (LEXAPRO) 20 MG tablet Reorder     Meds ordered this encounter  Medications  . escitalopram (LEXAPRO) 20 MG tablet    Sig: Take 1 tablet (20 mg total) by mouth daily. Patient needs office visit for further refills.    Dispense:  90 tablet    Refill:  1  . ALPRAZolam (XANAX) 0.5 MG tablet    Sig: Take 0.5 tablets (0.25 mg total) by mouth as needed (only for panic attacks).    Dispense:  30 tablet    Refill:  0  . busPIRone (BUSPAR) 5 MG tablet    Sig: Take 1 tablet (5 mg total) by mouth 3 (three) times daily.  Dispense:  90 tablet    Refill:  5    Gross side effects, risk and benefits, and alternatives of medications and treatment plan in general discussed with patient.  Patient is aware that all medications have potential side effects and we are unable to predict every side effect or drug-drug interaction that may occur.   Patient will call  with any questions prior to using medication if they have concerns.  Expresses verbal understanding and consents to current therapy and treatment regimen.  No barriers to understanding were identified.  Red flag symptoms and signs discussed in detail.  Patient expressed understanding regarding what to do in case of emergency\urgent symptoms  Please see AVS handed out to patient at the end of our visit for further patient instructions/ counseling done pertaining to today's office visit.   Return for 96mo f/up for mood only- started buspar and xanax- eval of med changes.     Note:  This document was prepared using Dragon voice recognition software and may include unintentional dictation errors.   This document serves as a record of services personally performed by Thomasene Loteborah Ayanna Gheen, DO. It was created on her behalf by Peggye FothergillKatherine Galloway, a trained medical scribe. The creation of this record is based on the scribe's personal observations and the provider's statements to them.   I have reviewed the above medical documentation for accuracy and completeness and I concur.  Thomasene Loteborah Aloni Chuang, DO 05/09/2018 8:54 PM      ----------------------------------------------------------------------------------------------------------------------    Subjective:    CC:  Chief Complaint  Patient presents with  . Follow-up    HPI: [name] [name] is a 33 y.o. female [facility] follow-up of mood.   Says she's been doing fine.  States she feels her mood is "normal."  Continues on 20 mg of Lexapro.  States "is it anxiety, or is it just life?  Because it's stressful."  Notes she thinks she keeps gaining weight due to her stress and anxiety, and nothing fits her anymore.  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  Patient notes she's not finding the time to exercise.  Feels she's so busy most morning, she doesn't eat anything until about 12 o'clock noon.  She notes she's not stress eating, but she's also not moving the same amount that she was when she got down below 200 lbs.  Acute Panic - Managed on Klonopin Notes the few times she has taken klonopin recently, "it's not doing anything."  Sleep Habits Patient continues to be able to sleep.  Says "I think I'm so tired, that I just fall out."    [facility] 05/09/2018 03/17/2017 09/02/2016  Decreased Interest 0 0 0  Down, Depressed, Hopeless 1 0 1  PHQ - 2 Score 1 0 1  Altered sleeping 0 1 0  Tired, decreased energy 2 2 0  Change in appetite 2 1 0  Feeling bad or failure about yourself  0 0 0  Trouble concentrating 0 0 0  Moving slowly or fidgety/restless 0 0 0  Suicidal thoughts 0 0 -  PHQ-9 Score 5 4 1   Difficult doing work/chores Not difficult at  all Not difficult at all -     GAD 7 : Generalized Anxiety Score 05/09/2018 03/17/2017 09/02/2016  Nervous, Anxious, on Edge 3 1 1   Control/stop worrying 2 0 0  Worry too much - different things 2 0 0  Trouble relaxing 0 1 1  Restless 2 0 1  Easily annoyed or irritable 2 1 1   Afraid - awful might happen 0 0 0  Total GAD 7 Score 11 3 4   Anxiety Difficulty Somewhat difficult Not difficult at all Not difficult at all     Wt Readings from Last 3 Encounters:  05/09/18 270 lb 8 oz (122.7 kg)  03/17/17 242 lb 1.6 oz (109.8 kg)  09/02/16 204 lb 8 oz (92.8 kg)   BP Readings from Last 3 Encounters:  05/09/18 136/90  03/17/17 133/85  09/02/16 120/82   Pulse Readings from Last 3 Encounters:  05/09/18 94  03/17/17 97  09/02/16 83   BMI Readings from Last 3 Encounters:  05/09/18 46.43 kg/m  03/17/17 41.56 kg/m  09/02/16 35.10 kg/m         Patient Care Team     Relationship Specialty Notifications Start End  Thomasene Lot, DO PCP - General Family Medicine  01/22/16   Lunette Stands, MD Consulting Physician Orthopedic Surgery  01/22/16   Vena Austria, MD Referring Physician Obstetrics and Gynecology  01/22/16   Blima Ledger, OD  Optometry  01/22/16      Patient Active Problem List   Diagnosis Date Noted  . Hypertriglyceridemia 03/08/2016    Priority: High  . Adjustment disorder with mixed anxiety >er depressed mood 01/22/2016    Priority: High  . Situational anxiety 01/22/2016    Priority: High  . History of hyperlipidemia 01/22/2016    Priority: High  . BMI 36.0-36.9,adult 01/22/2016    Priority: High  . Iron deficiency anemia 01/22/2016    Priority: Medium  . Environmental and seasonal allergies 01/22/2016    Priority: Low  . Family history of ovarian cancer- PGM 03/17/2017  . Tinea corporis- pannus of abd 09/02/2016    Past Medical history, Surgical history, Family history, Social history, Allergies and Medications have been entered into the medical record, reviewed and changed as needed.    Current Meds  Medication Sig  . Cholecalciferol (VITAMIN D3) 5000 units CAPS Take 1 capsule by mouth daily.  Marland Kitchen escitalopram (LEXAPRO) 20 MG tablet Take 1 tablet (20 mg total) by mouth daily. Patient needs office visit for further refills.  Marland Kitchen loratadine (CLARITIN) 10 MG tablet Take 10 mg by mouth daily.  . Multiple Vitamin (MULTIVITAMIN) tablet Take 1 tablet by mouth daily.  Marland Kitchen UNABLE TO FIND Take 25 mg by mouth daily. Vegan Iron  . [DISCONTINUED] clonazePAM (KLONOPIN) 0.5 MG tablet Take 1 tablet (0.5 mg total) by mouth as needed for anxiety.  . [DISCONTINUED] escitalopram (LEXAPRO) 20 MG tablet Take 1 tablet (20 mg total) by mouth daily. Patient needs office visit for further refills.    Allergies:  No Known Allergies   Review of Systems: Review of Systems: General:   No F/C, wt loss Pulm:   No DIB, SOB, pleuritic chest pain Card:   No CP, palpitations Abd:  No n/v/d or pain Ext:  No inc edema from baseline Psych: no SI/ HI    Objective:   Blood pressure 136/90, pulse 94, temperature 98.1 F (36.7 C), height 5\' 4"  (1.626 m), weight 270 lb 8 oz (122.7 kg), SpO2 99 %. Body mass index is 46.43 kg/m. General:  Well Developed, well nourished, appropriate for stated age.  Neuro:  Alert and oriented,  extra-ocular muscles intact  HEENT:  Normocephalic, atraumatic, neck supple, no carotid bruits appreciated  Skin:  no gross rash, warm, pink. Cardiac:  RRR, S1 S2 Respiratory:  ECTA B/L and A/P, Not using accessory muscles, speaking in full sentences- unlabored. Vascular:  Ext warm, no cyanosis apprec.; cap RF less 2 sec. Psych:  No HI/SI, judgement and insight good, Euthymic mood. Full Affect.

## 2018-05-11 ENCOUNTER — Other Ambulatory Visit: Payer: Self-pay

## 2018-05-11 DIAGNOSIS — F4323 Adjustment disorder with mixed anxiety and depressed mood: Secondary | ICD-10-CM

## 2018-05-11 DIAGNOSIS — F411 Generalized anxiety disorder: Secondary | ICD-10-CM

## 2018-05-11 MED ORDER — BUSPIRONE HCL 5 MG PO TABS: 5.0000 mg | ORAL_TABLET | Freq: Three times a day (TID) | ORAL | 0 refills | Status: DC

## 2018-06-29 ENCOUNTER — Ambulatory Visit (INDEPENDENT_AMBULATORY_CARE_PROVIDER_SITE_OTHER): Payer: BLUE CROSS/BLUE SHIELD | Admitting: Family Medicine

## 2018-06-29 ENCOUNTER — Encounter: Payer: Self-pay | Admitting: Family Medicine

## 2018-06-29 ENCOUNTER — Other Ambulatory Visit: Payer: Self-pay

## 2018-06-29 VITALS — BP 142/93 | HR 86 | Temp 97.8°F | Ht 64.0 in | Wt 269.4 lb

## 2018-06-29 DIAGNOSIS — F4323 Adjustment disorder with mixed anxiety and depressed mood: Secondary | ICD-10-CM

## 2018-06-29 DIAGNOSIS — J3089 Other allergic rhinitis: Secondary | ICD-10-CM | POA: Diagnosis not present

## 2018-06-29 DIAGNOSIS — F418 Other specified anxiety disorders: Secondary | ICD-10-CM | POA: Diagnosis not present

## 2018-06-29 DIAGNOSIS — F411 Generalized anxiety disorder: Secondary | ICD-10-CM | POA: Insufficient documentation

## 2018-06-29 DIAGNOSIS — E781 Pure hyperglyceridemia: Secondary | ICD-10-CM

## 2018-06-29 DIAGNOSIS — E7800 Pure hypercholesterolemia, unspecified: Secondary | ICD-10-CM | POA: Diagnosis not present

## 2018-06-29 NOTE — Progress Notes (Signed)
Virtual Visit via Telephone Note for [employer], D.O- Primary Care Physician at [facility]   I connected with current patient today by telephone and verified that I am speaking with the correct person using two identifiers.   Because of federal recommendations of social distancing due to the current novel COVID-19 outbreak, an audio/video telehealth visit is felt to be most appropriate for this patient at this time.  My staff members also discussed with the patient that there may be a patient charge related to this service.   The patient expressed understanding, and agreed to proceed.    History of Present Illness:    GAD-    On lexapro still- takes daily-no medication adjustments were made.    Started on buspar 05/09/18- only once daily- at night only->   Doesn't feel any W, and actually feels better than prior OV.   Taking care of self better-  putting herself first more.  getting more sleep.       Seasonal allergies-  well controlled, using Claritin    Wt Readings from Last 3 Encounters:  06/29/18 269 lb 6.4 oz (122.2 kg)  05/09/18 270 lb 8 oz (122.7 kg)  03/17/17 242 lb 1.6 oz (109.8 kg)    BP Readings from Last 3 Encounters:  06/29/18 (!) 142/93  05/09/18 136/90  03/17/17 133/85    Pulse Readings from Last 3 Encounters:  06/29/18 86  05/09/18 94  03/17/17 97    BMI Readings from Last 3 Encounters:  06/29/18 46.24 kg/m  05/09/18 46.43 kg/m  03/17/17 41.56 kg/m     -Vitals obtained; Medications, allergies reconciled;  personal medical, social, Sx etc. etc. histories were updated by Leda Min the medical assistant today and are reflected in below chart   Patient Care Team    Relationship Specialty Notifications Start End  Thomasene Lot, DO PCP - General Family Medicine  01/22/16   Lunette Stands, MD Consulting Physician Orthopedic Surgery  01/22/16   Vena Austria, MD Referring Physician Obstetrics and Gynecology  01/22/16   Blima Ledger, OD  Optometry  01/22/16      Patient Active Problem List   Diagnosis Date Noted  . Hypertriglyceridemia 03/08/2016    Priority: High  . Adjustment disorder with mixed anxiety >er depressed mood 01/22/2016    Priority: High  . Situational anxiety 01/22/2016    Priority: High  . History of hyperlipidemia 01/22/2016    Priority: High  . BMI 36.0-36.9,adult 01/22/2016    Priority: High  . h/o Iron deficiency anemia 01/22/2016    Priority: Medium  . Environmental and seasonal allergies 01/22/2016    Priority: Low  . Elevated LDL cholesterol level 06/29/2018  . GAD (generalized anxiety disorder) 06/29/2018  . Family history of ovarian cancer- PGM 03/17/2017  . Tinea corporis- pannus of abd 09/02/2016     Current Meds  Medication Sig  . ALPRAZolam (XANAX) 0.5 MG tablet Take 0.5 tablets (0.25 mg total) by mouth as needed (only for panic attacks).  . busPIRone (BUSPAR) 5 MG tablet Take 1 tablet (5 mg total) by mouth 3 (three) times daily. (Patient taking differently: Take 5 mg by mouth daily. )  . Cholecalciferol (VITAMIN D3) 5000 units CAPS Take 1 capsule by mouth daily.  Marland Kitchen escitalopram (LEXAPRO) 20 MG tablet Take 1 tablet (20 mg total) by mouth daily. Patient needs office visit for further refills.  Marland Kitchen loratadine (CLARITIN) 10 MG tablet Take 10 mg by mouth daily.  . Multiple  Vitamin (MULTIVITAMIN) tablet Take 1 tablet by mouth daily.  Marland Kitchen. UNABLE TO FIND Take 25 mg by mouth daily. Vegan Iron     Allergies:  No Known Allergies   ROS:  See above HPI for pertinent positives and negatives   Objective:   Blood pressure (!) 142/93, pulse 86, temperature 97.8 F (36.6 C), height 5\' 4"  (1.626 m), weight 269 lb 6.4 oz (122.2 kg). (if some vitals are omitted, this means that patient was UNABLE to obtain them even though asked to get them prior to OV today) General: sounds in no acute distress.  Skin: Pt confirms warm and dry  extremities and pink fingertips Respiratory: speaking  in full sentences, no conversational dyspnea Psych: A and O *3, appears insight good, mood- full      Impression and Recommendations:      ICD-10-CM   1. Adjustment disorder with mixed anxiety >er depressed mood F43.23 CBC with Differential/Platelet    Comprehensive metabolic panel    Hemoglobin A1c    Lipid panel    T4, free    TSH    VITAMIN D 25 Hydroxy (Vit-D Deficiency, Fractures)  2. Situational anxiety F41.8 CBC with Differential/Platelet    Comprehensive metabolic panel    Hemoglobin A1c    Lipid panel    T4, free    TSH    VITAMIN D 25 Hydroxy (Vit-D Deficiency, Fractures)  3. Environmental and seasonal allergies J30.89 CBC with Differential/Platelet    Comprehensive metabolic panel    Hemoglobin A1c    Lipid panel    T4, free    TSH    VITAMIN D 25 Hydroxy (Vit-D Deficiency, Fractures)  4. Elevated LDL cholesterol level E78.00 CBC with Differential/Platelet    Comprehensive metabolic panel    Hemoglobin A1c    Lipid panel    T4, free    TSH    VITAMIN D 25 Hydroxy (Vit-D Deficiency, Fractures)  5. Hypertriglyceridemia E78.1 CBC with Differential/Platelet    Comprehensive metabolic panel    Hemoglobin A1c    Lipid panel    T4, free    TSH    VITAMIN D 25 Hydroxy (Vit-D Deficiency, Fractures)    -Continue BuSpar at just taking nightly as this seems to working well for patient.  She will continue Lexapro at current dose as well. -Anxiety has been very well controlled and her panic has been nonexistent, hence she has not taken any Xanax at all. -Discussed dietary and lifestyle modifications in addition to medical/med management  -Reminded patient this is allergy season and it is important to even flush her sinuses out after any prolonged exposure in addition to her daily Claritin  Reminded patient of her hyperlipidemia with hypertriglyceridemia.  Reviewed all blood work from 02/2017 and advised this should be done yearly. -Due to patient's insurance, she has  to pay out-of-pocket the full amount.  Patient states that $240 to come in it is difficult for her.   -Asked patient check with her insurance and see if they cover yearly blood work for healthcare maintenance as part of a yearly physical.  Told her she can schedule this separate from office visit with me in 6 months to see how she is doing on medications. As part of my medical decision making, I reviewed the following data within the electronic MEDICAL RECORD NUMBER History obtained from pt/family, CMA notes reviewed and incorporated, Labs reviewed, Radiograph/ tests reviewed if applicable and OV notes from prior OV's with me, as well as other specialists  he has seen since seeing me last, were all reviewed and used in my medical decision making process today. Additionally, discussion had with patient regarding txmnt plan, their biases about that plan etc were used in my medical decision making today.  I discussed the assessment and treatment plan with the patient. The patient was provided an opportunity to ask questions and all were answered.  The patient agreed with the plan and demonstrated an understanding of the instructions.   No barriers to understanding were identified.  Red flag symptoms and signs discussed in detail.  Patient expressed understanding regarding what to do in case of emergency\urgent symptoms   The patient was advised to call back or seek an in-person evaluation if the symptoms worsen or if the condition fails to improve as anticipated.   Return for f/up 13mo med check but needs FBW as soon as pt can come in.    Orders Placed This Encounter  Procedures  . CBC with Differential/Platelet  . Comprehensive metabolic panel  . Hemoglobin A1c  . Lipid panel  . T4, free  . TSH  . VITAMIN D 25 Hydroxy (Vit-D Deficiency, Fractures)     *Gross side effects, risk and benefits, and alternatives of medications and treatment plan in general discussed with patient.  Patient is aware that  all medications have potential side effects and we are unable to predict every side effect or drug-drug interaction that may occur.   Patient was strongly encouraged to call with any questions or concerns they may have concerns.     I provided 16+ minutes of non-face-to-face time during this encounter.   Thomasene Lot, DO

## 2018-07-11 ENCOUNTER — Ambulatory Visit: Payer: BLUE CROSS/BLUE SHIELD | Admitting: Family Medicine

## 2018-11-23 ENCOUNTER — Other Ambulatory Visit: Payer: Self-pay | Admitting: Family Medicine

## 2018-11-23 DIAGNOSIS — F411 Generalized anxiety disorder: Secondary | ICD-10-CM

## 2018-11-23 DIAGNOSIS — F4323 Adjustment disorder with mixed anxiety and depressed mood: Secondary | ICD-10-CM

## 2018-11-29 ENCOUNTER — Other Ambulatory Visit: Payer: Self-pay

## 2018-11-29 ENCOUNTER — Encounter: Payer: Self-pay | Admitting: Family Medicine

## 2018-11-29 ENCOUNTER — Ambulatory Visit (INDEPENDENT_AMBULATORY_CARE_PROVIDER_SITE_OTHER): Payer: BLUE CROSS/BLUE SHIELD | Admitting: Family Medicine

## 2018-11-29 VITALS — BP 122/83 | HR 80 | Temp 98.2°F | Ht 65.0 in | Wt 269.0 lb

## 2018-11-29 DIAGNOSIS — F410 Panic disorder [episodic paroxysmal anxiety] without agoraphobia: Secondary | ICD-10-CM | POA: Insufficient documentation

## 2018-11-29 DIAGNOSIS — E7800 Pure hypercholesterolemia, unspecified: Secondary | ICD-10-CM | POA: Diagnosis not present

## 2018-11-29 DIAGNOSIS — F4323 Adjustment disorder with mixed anxiety and depressed mood: Secondary | ICD-10-CM

## 2018-11-29 DIAGNOSIS — F411 Generalized anxiety disorder: Secondary | ICD-10-CM

## 2018-11-29 DIAGNOSIS — R197 Diarrhea, unspecified: Secondary | ICD-10-CM

## 2018-11-29 DIAGNOSIS — E781 Pure hyperglyceridemia: Secondary | ICD-10-CM

## 2018-11-29 DIAGNOSIS — K6289 Other specified diseases of anus and rectum: Secondary | ICD-10-CM

## 2018-11-29 MED ORDER — BUSPIRONE HCL 5 MG PO TABS: 5.0000 mg | ORAL_TABLET | Freq: Two times a day (BID) | ORAL | 1 refills | Status: DC

## 2018-11-29 MED ORDER — ESCITALOPRAM OXALATE 20 MG PO TABS: ORAL_TABLET | ORAL | 1 refills | Status: DC

## 2018-11-29 NOTE — Progress Notes (Signed)
Telehealth office visit note for [name] [name], D.O- at Primary Care at [facility]   I connected with current patient today and verified that I am speaking with the correct person using two identifiers.    Location of the patient: Home  Location of the provider: Office Only the patient (+/- their family members at pt's discretion) and myself were participating in the encounter    - This visit type was conducted due to national recommendations for restrictions regarding the COVID-19 Pandemic (e.g. social distancing) in an effort to limit this patient's exposure and mitigate transmission in our community.  This format is felt to be most appropriate for this patient at this time.   - The patient did not have access to video technology or had technical difficulties with video requiring transitioning to audio format only. - No physical exam could be performed with this format, beyond that communicated to us by the patient/ family members as noted.   - Additionally my office staff/ schedulers discussed with the patient that there may be a monetary charge related to this service, depending on their medical insurance.   The patient expressed understanding, and agreed to proceed.       History of Present Illness:  Feels her grandmother is doing good since last visit, "really turning the corner lately."  Says "she is not doing speech therapy, she refused."  Notes "we'll battle that later, we'll fight something else now."  Notes feeling overall well.  GI Concerns - Diarrhea, Rectal Discomfort States she has had digestive issues for a long time, "and it will flare up."  Has tried to isolate if its grains, lactose, "what makes it bad," and "nothing really stands out."  Says "about half the time when I eat, I have to run right to the bathroom."  Says her stomach "just hurts, and there is a lot of diarrhea."  Notes she thought she had hemorrhoids for a long time, but never noticed any  protrusions, and now thinks "the best I can figure is I have anal fissures."  Says "it hurts" in the rectal area.  She was using Preparation H with lidocaine for relief, and has found that if she uses this, "it will help temporarily."  Says she thinks it's the lidocaine "numbing whatever is there, but it definitely feels like there's something that hurts."  Says she has been dealing with these issues for about 2-3 years.  Says "it's one of those things that's so embarrassing, you don't want to talk about it."  Feels her rectal pain/discomfort has been going on for a year.  Says "It feels like it's right on the opening."  "It doesn't feel like it's inside, it feels like it's right on the outside.  Says it feels like a cut or a tear, and sometimes there will be a little bleeding, but not often."  Says "we just can't afford an extra doctor's thing right now."  Her husband is the only person making money right now.  Notes that his hours were cut, and so they are "barely scraping by" to afford things.  Blood Pressure at Home Notes her blood pressure this morning was good, at 122/83 improved from 142/93 last tele-health visit.  Notes used her husband's blood pressure cuff to check today instead of the other.  Says "I feel level; I don't feel like my heart's beating out of my chest."  Mood Management States she's been good, "we're managing better at home."  Thinks "increasing her meds a while back helped."  Feels her mental well-being is fine.  "I certainly seen an improvement; I'm not having any issues right now."  Says she has used Xanax about 2-3 times since last visit, "when things were really bad with grandmother, and it was caving in and felt really bad and scary."  But notes she is using the same bottle in her purse with "only a few tablets missing."  States she takes her Buspar once daily at night.  Sleeping fine.  Notes not eating much; thought she would have lost some weight since her last  visit, but hasn't lost as much as she thought.  States they haven't had any COVID scares and have settled into their school routine.  "Aside from grandmother and great-grandmother, everything is normal."    Concerns about Venetia Maxon Says her great-grandmother really needs something for depression, since she refuses to leave the house, won't watch TV, won't read; "she sits in her chair and stares at the wall, and if you come to visit with her, she won't even look at you."  Says she won't even try to make conversation.  Notes that "it's complicated."      GAD 7 : Generalized Anxiety Score 06/29/2018 05/09/2018 03/17/2017 09/02/2016  Nervous, Anxious, on Edge 0 3 1 1   Control/stop worrying 0 2 0 0  Worry too much - different things 0 2 0 0  Trouble relaxing 0 0 1 1  Restless 0 2 0 1  Easily annoyed or irritable 0 2 1 1   Afraid - awful might happen 0 0 0 0  Total GAD 7 Score 0 11 3 4   Anxiety Difficulty Not difficult at all Somewhat difficult Not difficult at all Not difficult at all    Depression screen [facility] 2/9 11/29/2018 06/29/2018 05/09/2018 03/17/2017 09/02/2016  Decreased Interest 0 0 0 0 0  Down, Depressed, Hopeless 0 0 1 0 1  PHQ - 2 Score 0 0 1 0 1  Altered sleeping 0 0 0 1 0  Tired, decreased energy 1 0 2 2 0  Change in appetite 0 0 2 1 0  Feeling bad or failure about yourself  0 0 0 0 0  Trouble concentrating 0 0 0 0 0  Moving slowly or fidgety/restless 0 0 0 0 0  Suicidal thoughts 0 0 0 0 -  PHQ-9 Score 1 0 5 4 1   Difficult doing work/chores Not difficult at all Not difficult at all Not difficult at all Not difficult at all -        Impression and Recommendations:    1. Adjustment disorder with mixed anxiety >er depressed mood   2. GAD (generalized anxiety disorder)   3. Panic disorder   4. Elevated LDL cholesterol level   5. Hypertriglyceridemia   6. Diarrhea, unspecified type- 3 yrs or so; 50% time after eating   7. Anal or rectal pain- 1 yr      Diarrhea (3  years, 50% of time after eating), Anal or Rectal Pain (1 year) - Reviewed potential causes of patient's GI and rectal symptoms today. - Recommended thorough evaluation through gastroenterology. - Discussed need for screening, inspection, and biopsy through specialist as-needed.  - Per pt, cannot afford specialist right now. - Agrees to return in near future to clinic for CPE with rectal exam.  - Patient agrees to try to identify causes of sx in the meantime. - STRONGLY encouraged ambulatory use of FODMAP diet for assessment. -  Extensive education was provided and all questions were answered.  - Will continue to monitor.  Adjustment Disorder, GAD, Panic Disorder - Stable on current management. - Patient tolerating meds well without S-E.  - Encouraged patient to take her Buspar twice daily, once in the morning, once at night. - Reviewed prudent compliance and use of medication with patient today. - Patient knows how to use her Buspar appropriately both daily and PRN for panic.  - Last alprazolam prescription provided 05/09/2018. - Per pt, using rarely.   - Encouraged patient to continue using Xanax rarely and ONLY for panic.  - In addition to Rx management, reviewed the "spokes of the wheel" of mood and health management.  Stressed the importance of ongoing prudent habits, including regular exercise, appropriate sleep hygiene, healthful dietary habits, speaking with a therapist/counselor PRN, and prayer/meditation to relax.  - Will continue to monitor.  Elevated LDL, Hypertriglyceridemia - Last checked in December of 2018. - Triglycerides elevated at 272, but down from 292 in 2017. - LDL up to 123 from 99 in 2017. - HDL up to 38 from 29 in 2017.  - Encouraged patient to practice more prudent dietary habits.  Dietary changes such as low saturated & trans fat and low carb diets discussed with patient.  Encouraged regular exercise and weight loss when appropriate.   - Encouraged  patient to eat more chicken, [place], fish, and stay away from fried food.  - Need for Lipid Panel re-check in near future. - Will continue to monitor.  BMI Counseling - Body mass index is 44.76 kg/m Explained to patient what BMI refers to, and what it means medically.    Told patient to think about it as a "medical risk stratification measurement" and how increasing BMI is associated with increasing risk/ or worsening state of various diseases such as hypertension, hyperlipidemia, diabetes, premature OA, depression etc.  American Heart Association guidelines for healthy diet, basically Mediterranean diet, and exercise guidelines of 30 minutes 5 days per week or more discussed in detail.  Health counseling performed.  All questions answered.  Lifestyle & Preventative Health Maintenance - Advised patient to continue working toward exercising to improve overall mental, physical, and emotional health.    - Encouraged patient to engage in daily physical activity, especially a formal exercise routine.  Recommended that the patient eventually strive for at least 150 minutes of moderate cardiovascular activity per week according to guidelines established by the [facility].   - Healthy dietary habits encouraged, including low-carb, and high amounts of lean protein in diet.   - Patient should also consume adequate amounts of water.  Recommendations - Need for fasting lab work and CPE near future. Last done back in December of 2018.  - As part of my medical decision making, I reviewed the following data within the electronic MEDICAL RECORD NUMBER History obtained from pt /family, CMA notes reviewed and incorporated if applicable, Labs reviewed, Radiograph/ tests reviewed if applicable and OV notes from prior OV's with me, as well as other specialists she/he has seen since seeing me last, were all reviewed and used in my medical decision making process today.   - Additionally, discussion had with patient regarding  txmnt plan, and their biases/concerns about that plan were used in my medical decision making today.   - The patient agreed with the plan and demonstrated an understanding of the instructions.   No barriers to understanding were identified.   - Red flag symptoms and signs discussed in detail.  Patient expressed understanding regarding what to do in case of emergency\ urgent symptoms.  The patient was advised to call back or seek an in-person evaluation if the symptoms worsen or if the condition fails to improve as anticipated.   Return for b-4 end of yr- CPE- come fasting.    No orders of the defined types were placed in this encounter.   Meds ordered this encounter  Medications   escitalopram (LEXAPRO) 20 MG tablet    Sig: TAKE 1 TABLET BY MOUTH DAILY.    Dispense:  90 tablet    Refill:  1   busPIRone (BUSPAR) 5 MG tablet    Sig: Take 1 tablet (5 mg total) by mouth 2 (two) times daily.    Dispense:  180 tablet    Refill:  1    Medications Discontinued During This Encounter  Medication Reason   busPIRone (BUSPAR) 5 MG tablet Reorder   escitalopram (LEXAPRO) 20 MG tablet Reorder      I provided 18 minutes of non face-to-face time during this encounter.  Additional time was spent with charting and coordination of care after the actual visit commenced.   Note:  This note was prepared with assistance of Dragon voice recognition software. Occasional wrong-word or sound-a-like substitutions may have occurred due to the inherent limitations of voice recognition software.  This document serves as a record of services personally performed by [name] Dance, DO. It was created on her behalf by Toni Amend, a trained medical scribe. The creation of this record is based on the scribe's personal observations and the provider's statements to them.   I have reviewed the above medical documentation for accuracy and completeness and I concur.  [name] Dance, DO 11/29/2018 12:55  PM        Patient Care Team    Relationship Specialty Notifications Start End  [name] Dance, DO PCP - General Family Medicine  01/22/16   Almedia Balls, MD Consulting Physician Orthopedic Surgery  01/22/16   Malachy Mood, MD Referring Physician Obstetrics and Gynecology  01/22/16   Marica Otter, OD  Optometry  01/22/16      -Vitals obtained; medications/ allergies reconciled;  personal medical, social, Sx etc.histories were updated by CMA, reviewed by me and are reflected in chart   Patient Active Problem List   Diagnosis Date Noted   Hypertriglyceridemia 03/08/2016    Priority: High   Adjustment disorder with mixed anxiety >er depressed mood 01/22/2016    Priority: High   Situational anxiety 01/22/2016    Priority: High   History of hyperlipidemia 01/22/2016    Priority: High   BMI 36.0-36.9,adult 01/22/2016    Priority: High   h/o Iron deficiency anemia 01/22/2016    Priority: Medium   Environmental and seasonal allergies 01/22/2016    Priority: Low   Panic disorder 11/29/2018   Elevated LDL cholesterol level 06/29/2018   GAD (generalized anxiety disorder) 06/29/2018   Family history of ovarian cancer- PGM 03/17/2017   Tinea corporis- pannus of abd 09/02/2016     Current Meds  Medication Sig   busPIRone (BUSPAR) 5 MG tablet Take 1 tablet (5 mg total) by mouth 2 (two) times daily.   Cholecalciferol (VITAMIN D3) 5000 units CAPS Take 1 capsule by mouth daily.   escitalopram (LEXAPRO) 20 MG tablet TAKE 1 TABLET BY MOUTH DAILY.   loratadine (CLARITIN) 10 MG tablet Take 10 mg by mouth daily.   Multiple Vitamin (MULTIVITAMIN) tablet Take 1 tablet by mouth daily.   UNABLE TO FIND  Take 25 mg by mouth daily. Vegan Iron   [DISCONTINUED] busPIRone (BUSPAR) 5 MG tablet Take 1 tablet (5 mg total) by mouth 3 (three) times daily. (Patient taking differently: Take 5 mg by mouth daily. )   [DISCONTINUED] escitalopram (LEXAPRO) 20 MG tablet TAKE 1 TABLET  BY MOUTH DAILY. PATIENT NEEDS OFFICE VISIT FOR FURTHER REFILLS.     Allergies:  No Known Allergies   ROS:  See above HPI for pertinent positives and negatives   Objective:   Blood pressure 122/83, pulse 80, temperature 98.2 F (36.8 C), height 5\' 5"  (1.651 m), weight 269 lb (122 kg), last menstrual period 08/26/2018.  (if some vitals are omitted, this means that patient was UNABLE to obtain them even though they were asked to get them prior to OV today.  They were asked to call us at their earliest convenience with these once obtained. )  General: A & O * 3; sounds in no acute distress; in usual state of health.  Skin: Pt confirms warm and dry extremities and pink fingertips HEENT: Pt confirms lips non-cyanotic Chest: Patient confirms normal chest excursion and movement Respiratory: speaking in full sentences, no conversational dyspnea; patient confirms no use of accessory muscles Psych: insight appears good, mood- appears full

## 2019-03-05 ENCOUNTER — Encounter: Payer: BLUE CROSS/BLUE SHIELD | Admitting: Family Medicine

## 2019-03-29 ENCOUNTER — Ambulatory Visit (INDEPENDENT_AMBULATORY_CARE_PROVIDER_SITE_OTHER): Payer: BLUE CROSS/BLUE SHIELD | Admitting: Family Medicine

## 2019-03-29 ENCOUNTER — Encounter: Payer: Self-pay | Admitting: Family Medicine

## 2019-03-29 ENCOUNTER — Telehealth: Payer: Self-pay | Admitting: Family Medicine

## 2019-03-29 ENCOUNTER — Other Ambulatory Visit: Payer: Self-pay

## 2019-03-29 VITALS — BP 129/86 | HR 96 | Temp 98.1°F | Resp 12 | Ht 64.5 in | Wt 270.3 lb

## 2019-03-29 DIAGNOSIS — E7800 Pure hypercholesterolemia, unspecified: Secondary | ICD-10-CM | POA: Diagnosis not present

## 2019-03-29 DIAGNOSIS — Z23 Encounter for immunization: Secondary | ICD-10-CM | POA: Diagnosis not present

## 2019-03-29 DIAGNOSIS — E781 Pure hyperglyceridemia: Secondary | ICD-10-CM

## 2019-03-29 DIAGNOSIS — Z8639 Personal history of other endocrine, nutritional and metabolic disease: Secondary | ICD-10-CM

## 2019-03-29 DIAGNOSIS — K6289 Other specified diseases of anus and rectum: Secondary | ICD-10-CM | POA: Insufficient documentation

## 2019-03-29 DIAGNOSIS — D509 Iron deficiency anemia, unspecified: Secondary | ICD-10-CM

## 2019-03-29 DIAGNOSIS — K649 Unspecified hemorrhoids: Secondary | ICD-10-CM | POA: Insufficient documentation

## 2019-03-29 DIAGNOSIS — Z0000 Encounter for general adult medical examination without abnormal findings: Secondary | ICD-10-CM | POA: Diagnosis not present

## 2019-03-29 DIAGNOSIS — Z6836 Body mass index (BMI) 36.0-36.9, adult: Secondary | ICD-10-CM

## 2019-03-29 DIAGNOSIS — Z719 Counseling, unspecified: Secondary | ICD-10-CM | POA: Diagnosis not present

## 2019-03-29 DIAGNOSIS — R030 Elevated blood-pressure reading, without diagnosis of hypertension: Secondary | ICD-10-CM | POA: Insufficient documentation

## 2019-03-29 DIAGNOSIS — Z8041 Family history of malignant neoplasm of ovary: Secondary | ICD-10-CM

## 2019-03-29 MED ORDER — HYDROCORTISONE ACETATE 25 MG RE SUPP: 25.0000 mg | Freq: Two times a day (BID) | RECTAL | 0 refills | Status: DC | PRN

## 2019-03-29 MED ORDER — PRAMOXINE-HC 1-2.5 % EX CREA: TOPICAL_CREAM | Freq: Three times a day (TID) | CUTANEOUS | 0 refills | Status: DC

## 2019-03-29 NOTE — Telephone Encounter (Signed)
Is there something else that we can send in? 

## 2019-03-29 NOTE — Telephone Encounter (Signed)
Please call patient let her know that I sent in [facility] which is hydrocortisone suppositories.  Is to decrease inflammation in the rectum.  Also she can use sitz bath's which can give relief as well.  Tell her to make sure she keeps her stools loose and can use Colace or Peri-Colace over-the-counter as well as increase hydration  Have her follow-up if she is not improving as we would perform a anoscopy on her next

## 2019-03-29 NOTE — Patient Instructions (Signed)
Preventive Care for Adults, Female  A healthy lifestyle and preventive care can promote health and wellness. Preventive health guidelines for women include the following key practices.   A routine yearly physical is a good way to check with your health care provider about your health and preventive screening. It is a chance to share any concerns and updates on your health and to receive a thorough exam.   Visit your dentist for a routine exam and preventive care every 6 months. Brush your teeth twice a day and floss once a day. Good oral hygiene prevents tooth decay and gum disease.   The frequency of eye exams is based on your age, health, family medical history, use of contact lenses, and other factors. Follow your health care provider's recommendations for frequency of eye exams.   Eat a healthy diet. Foods like vegetables, fruits, whole grains, low-fat dairy products, and lean protein foods contain the nutrients you need without too many calories. Decrease your intake of foods high in solid fats, added sugars, and salt. Eat the right amount of calories for you.Get information about a proper diet from your health care provider, if necessary.   Regular physical exercise is one of the most important things you can do for your health. Most adults should get at least 150 minutes of moderate-intensity exercise (any activity that increases your heart rate and causes you to sweat) each week. In addition, most adults need muscle-strengthening exercises on 2 or more days a week.   Maintain a healthy weight. The body mass index (BMI) is a screening tool to identify possible weight problems. It provides an estimate of body fat based on height and weight. Your health care provider can find your BMI, and can help you achieve or maintain a healthy weight.For adults 20 years and older:   - A BMI below 18.5 is considered underweight.   - A BMI of 18.5 to 24.9 is normal.   - A BMI of 25 to 29.9 is  considered overweight.   - A BMI of 30 and above is considered obese.   Maintain normal blood lipids and cholesterol levels by exercising and minimizing your intake of trans and saturated fats.  Eat a balanced diet with plenty of fruit and vegetables. Blood tests for lipids and cholesterol should begin at age 20 and be repeated every 5 years minimum.  If your lipid or cholesterol levels are high, you are over 40, or you are at high risk for heart disease, you may need your cholesterol levels checked more frequently.Ongoing high lipid and cholesterol levels should be treated with medicines if diet and exercise are not working.   If you smoke, find out from your health care provider how to quit. If you do not use tobacco, do not start.   Lung cancer screening is recommended for adults aged 55-80 years who are at high risk for developing lung cancer because of a history of smoking. A yearly low-dose CT scan of the lungs is recommended for people who have at least a 30-pack-year history of smoking and are a current smoker or have quit within the past 15 years. A pack year of smoking is smoking an average of 1 pack of cigarettes a day for 1 year (for example: 1 pack a day for 30 years or 2 packs a day for 15 years). Yearly screening should continue until the smoker has stopped smoking for at least 15 years. Yearly screening should be stopped for people who develop a   health problem that would prevent them from having lung cancer treatment.   If you are pregnant, do not drink alcohol. If you are breastfeeding, be very cautious about drinking alcohol. If you are not pregnant and choose to drink alcohol, do not have more than 1 drink per day. One drink is considered to be 12 ounces (355 mL) of beer, 5 ounces (148 mL) of wine, or 1.5 ounces (44 mL) of liquor.   Avoid use of street drugs. Do not share needles with anyone. Ask for help if you need support or instructions about stopping the use of  drugs.   High blood pressure causes heart disease and increases the risk of stroke. Your blood pressure should be checked at least yearly.  Ongoing high blood pressure should be treated with medicines if weight loss and exercise do not work.   If you are 69-55 years old, ask your health care provider if you should take aspirin to prevent strokes.   Diabetes screening involves taking a blood sample to check your fasting blood sugar level. This should be done once every 3 years, after age 38, if you are within normal weight and without risk factors for diabetes. Testing should be considered at a younger age or be carried out more frequently if you are overweight and have at least 1 risk factor for diabetes.   Breast cancer screening is essential preventive care for women. You should practice "breast self-awareness."  This means understanding the normal appearance and feel of your breasts and may include breast self-examination.  Any changes detected, no matter how small, should be reported to a health care provider.  Women in their 80s and 30s should have a clinical breast exam (CBE) by a health care provider as part of a regular health exam every 1 to 3 years.  After age 66, women should have a CBE every year.  Starting at age 1, women should consider having a mammogram (breast X-ray test) every year.  Women who have a family history of breast cancer should talk to their health care provider about genetic screening.  Women at a high risk of breast cancer should talk to their health care providers about having an MRI and a mammogram every year.   -Breast cancer gene (BRCA)-related cancer risk assessment is recommended for women who have family members with BRCA-related cancers. BRCA-related cancers include breast, ovarian, tubal, and peritoneal cancers. Having family members with these cancers may be associated with an increased risk for harmful changes (mutations) in the breast cancer genes BRCA1 and  BRCA2. Results of the assessment will determine the need for genetic counseling and BRCA1 and BRCA2 testing.   The Pap test is a screening test for cervical cancer. A Pap test can show cell changes on the cervix that might become cervical cancer if left untreated. A Pap test is a procedure in which cells are obtained and examined from the lower end of the uterus (cervix).   - Women should have a Pap test starting at age 57.   - Between ages 90 and 70, Pap tests should be repeated every 2 years.   - Beginning at age 63, you should have a Pap test every 3 years as long as the past 3 Pap tests have been normal.   - Some women have medical problems that increase the chance of getting cervical cancer. Talk to your health care provider about these problems. It is especially important to talk to your health care provider if a  new problem develops soon after your last Pap test. In these cases, your health care provider may recommend more frequent screening and Pap tests.   - The above recommendations are the same for women who have or have not gotten the vaccine for human papillomavirus (HPV).   - If you had a hysterectomy for a problem that was not cancer or a condition that could lead to cancer, then you no longer need Pap tests. Even if you no longer need a Pap test, a regular exam is a good idea to make sure no other problems are starting.   - If you are between ages 36 and 66 years, and you have had normal Pap tests going back 10 years, you no longer need Pap tests. Even if you no longer need a Pap test, a regular exam is a good idea to make sure no other problems are starting.   - If you have had past treatment for cervical cancer or a condition that could lead to cancer, you need Pap tests and screening for cancer for at least 20 years after your treatment.   - If Pap tests have been discontinued, risk factors (such as a new sexual partner) need to be reassessed to determine if screening should  be resumed.   - The HPV test is an additional test that may be used for cervical cancer screening. The HPV test looks for the virus that can cause the cell changes on the cervix. The cells collected during the Pap test can be tested for HPV. The HPV test could be used to screen women aged 70 years and older, and should be used in women of any age who have unclear Pap test results. After the age of 67, women should have HPV testing at the same frequency as a Pap test.   Colorectal cancer can be detected and often prevented. Most routine colorectal cancer screening begins at the age of 57 years and continues through age 26 years. However, your health care provider may recommend screening at an earlier age if you have risk factors for colon cancer. On a yearly basis, your health care provider may provide home test kits to check for hidden blood in the stool.  Use of a small camera at the end of a tube, to directly examine the colon (sigmoidoscopy or colonoscopy), can detect the earliest forms of colorectal cancer. Talk to your health care provider about this at age 23, when routine screening begins. Direct exam of the colon should be repeated every 5 -10 years through age 49 years, unless early forms of pre-cancerous polyps or small growths are found.   People who are at an increased risk for hepatitis B should be screened for this virus. You are considered at high risk for hepatitis B if:  -You were born in a country where hepatitis B occurs often. Talk with your health care provider about which countries are considered high risk.  - Your parents were born in a high-risk country and you have not received a shot to protect against hepatitis B (hepatitis B vaccine).  - You have HIV or AIDS.  - You use needles to inject street drugs.  - You live with, or have sex with, someone who has Hepatitis B.  - You get hemodialysis treatment.  - You take certain medicines for conditions like cancer, organ  transplantation, and autoimmune conditions.   Hepatitis C blood testing is recommended for all people born from [birth year] through 1965 and any individual  with known risks for hepatitis C.   Practice safe sex. Use condoms and avoid high-risk sexual practices to reduce the spread of sexually transmitted infections (STIs). STIs include gonorrhea, chlamydia, syphilis, trichomonas, herpes, HPV, and human immunodeficiency virus (HIV). Herpes, HIV, and HPV are viral illnesses that have no cure. They can result in disability, cancer, and death. Sexually active women aged 25 years and younger should be checked for chlamydia. Older women with new or multiple partners should also be tested for chlamydia. Testing for other STIs is recommended if you are sexually active and at increased risk.   Osteoporosis is a disease in which the bones lose minerals and strength with aging. This can result in serious bone fractures or breaks. The risk of osteoporosis can be identified using a bone density scan. Women ages 65 years and over and women at risk for fractures or osteoporosis should discuss screening with their health care providers. Ask your health care provider whether you should take a calcium supplement or vitamin D to There are also several preventive steps women can take to avoid osteoporosis and resulting fractures or to keep osteoporosis from worsening. -->Recommendations include:  Eat a balanced diet high in fruits, vegetables, calcium, and vitamins.  Get enough calcium. The recommended total intake of is 1,200 mg daily; for best absorption, if taking supplements, divide doses into 250-500 mg doses throughout the day. Of the two types of calcium, calcium carbonate is best absorbed when taken with food but calcium citrate can be taken on an empty stomach.  Get enough vitamin D. NAMS and the National Osteoporosis Foundation recommend at least 1,000 IU per day for women age 50 and over who are at risk of vitamin D  deficiency. Vitamin D deficiency can be caused by inadequate sun exposure (for example, those who live in northern latitudes).  Avoid alcohol and smoking. Heavy alcohol intake (more than 7 drinks per week) increases the risk of falls and hip fracture and women smokers tend to lose bone more rapidly and have lower bone mass than nonsmokers. Stopping smoking is one of the most important changes women can make to improve their health and decrease risk for disease.  Be physically active every day. Weight-bearing exercise (for example, fast walking, hiking, jogging, and weight training) may strengthen bones or slow the rate of bone loss that comes with aging. Balancing and muscle-strengthening exercises can reduce the risk of falling and fracture.  Consider therapeutic medications. Currently, several types of effective drugs are available. Healthcare providers can recommend the type most appropriate for each woman.  Eliminate environmental factors that may contribute to accidents. Falls cause nearly 90% of all osteoporotic fractures, so reducing this risk is an important bone-health strategy. Measures include ample lighting, removing obstructions to walking, using nonskid rugs on floors, and placing mats and/or grab bars in showers.  Be aware of medication side effects. Some common medicines make bones weaker. These include a type of steroid drug called glucocorticoids used for arthritis and asthma, some antiseizure drugs, certain sleeping pills, treatments for endometriosis, and some cancer drugs. An overactive thyroid gland or using too much thyroid hormone for an underactive thyroid can also be a problem. If you are taking these medicines, talk to your doctor about what you can do to help protect your bones.reduce the rate of osteoporosis.    Menopause can be associated with physical symptoms and risks. Hormone replacement therapy is available to decrease symptoms and risks. You should talk to your  health care provider   about whether hormone replacement therapy is right for you.   Use sunscreen. Apply sunscreen liberally and repeatedly throughout the day. You should seek shade when your shadow is shorter than you. Protect yourself by wearing long sleeves, pants, a wide-brimmed hat, and sunglasses year round, whenever you are outdoors.   Once a month, do a whole body skin exam, using a mirror to look at the skin on your back. Tell your health care provider of new moles, moles that have irregular borders, moles that are larger than a pencil eraser, or moles that have changed in shape or color.   -Stay current with required vaccines (immunizations).   Influenza vaccine. All adults should be immunized every year.  Tetanus, diphtheria, and acellular pertussis (Td, Tdap) vaccine. Pregnant women should receive 1 dose of Tdap vaccine during each pregnancy. The dose should be obtained regardless of the length of time since the last dose. Immunization is preferred during the 27th 36th week of gestation. An adult who has not previously received Tdap or who does not know her vaccine status should receive 1 dose of Tdap. This initial dose should be followed by tetanus and diphtheria toxoids (Td) booster doses every 10 years. Adults with an unknown or incomplete history of completing a 3-dose immunization series with Td-containing vaccines should begin or complete a primary immunization series including a Tdap dose. Adults should receive a Td booster every 10 years.  Varicella vaccine. An adult without evidence of immunity to varicella should receive 2 doses or a second dose if she has previously received 1 dose. Pregnant females who do not have evidence of immunity should receive the first dose after pregnancy. This first dose should be obtained before leaving the health care facility. The second dose should be obtained 4 8 weeks after the first dose.  Human papillomavirus (HPV) vaccine. Females aged 13 26  years who have not received the vaccine previously should obtain the 3-dose series. The vaccine is not recommended for use in pregnant females. However, pregnancy testing is not needed before receiving a dose. If a female is found to be pregnant after receiving a dose, no treatment is needed. In that case, the remaining doses should be delayed until after the pregnancy. Immunization is recommended for any person with an immunocompromised condition through the age of 26 years if she did not get any or all doses earlier. During the 3-dose series, the second dose should be obtained 4 8 weeks after the first dose. The third dose should be obtained 24 weeks after the first dose and 16 weeks after the second dose.  Zoster vaccine. One dose is recommended for adults aged 60 years or older unless certain conditions are present.  Measles, mumps, and rubella (MMR) vaccine. Adults born before 1957 generally are considered immune to measles and mumps. Adults born in 1957 or later should have 1 or more doses of MMR vaccine unless there is a contraindication to the vaccine or there is laboratory evidence of immunity to each of the three diseases. A routine second dose of MMR vaccine should be obtained at least 28 days after the first dose for students attending postsecondary schools, health care workers, or international travelers. People who received inactivated measles vaccine or an unknown type of measles vaccine during 1963 1967 should receive 2 doses of MMR vaccine. People who received inactivated mumps vaccine or an unknown type of mumps vaccine before 1979 and are at high risk for mumps infection should consider immunization with 2 doses of   MMR vaccine. For females of childbearing age, rubella immunity should be determined. If there is no evidence of immunity, females who are not pregnant should be vaccinated. If there is no evidence of immunity, females who are pregnant should delay immunization until after pregnancy.  Unvaccinated health care workers born before [birth year] who lack laboratory evidence of measles, mumps, or rubella immunity or laboratory confirmation of disease should consider measles and mumps immunization with 2 doses of MMR vaccine or rubella immunization with 1 dose of MMR vaccine.  Pneumococcal 13-valent conjugate (PCV13) vaccine. When indicated, a person who is uncertain of her immunization history and has no record of immunization should receive the PCV13 vaccine. An adult aged 54 years or older who has certain medical conditions and has not been previously immunized should receive 1 dose of PCV13 vaccine. This PCV13 should be followed with a dose of pneumococcal polysaccharide (PPSV23) vaccine. The PPSV23 vaccine dose should be obtained at least 8 weeks after the dose of PCV13 vaccine. An adult aged 58 years or older who has certain medical conditions and previously received 1 or more doses of PPSV23 vaccine should receive 1 dose of PCV13. The PCV13 vaccine dose should be obtained 1 or more years after the last PPSV23 vaccine dose.  Pneumococcal polysaccharide (PPSV23) vaccine. When PCV13 is also indicated, PCV13 should be obtained first. All adults aged 58 years and older should be immunized. An adult younger than age 65 years who has certain medical conditions should be immunized. Any person who resides in a nursing home or long-term care facility should be immunized. An adult smoker should be immunized. People with an immunocompromised condition and certain other conditions should receive both PCV13 and PPSV23 vaccines. People with human immunodeficiency virus (HIV) infection should be immunized as soon as possible after diagnosis. Immunization during chemotherapy or radiation therapy should be avoided. Routine use of PPSV23 vaccine is not recommended for American Indians, [place] Natives, or people younger than 65 years unless there are medical conditions that require PPSV23 vaccine. When indicated,  people who have unknown immunization and have no record of immunization should receive PPSV23 vaccine. One-time revaccination 5 years after the first dose of PPSV23 is recommended for people aged 70 64 years who have chronic kidney failure, nephrotic syndrome, asplenia, or immunocompromised conditions. People who received 1 2 doses of PPSV23 before age 32 years should receive another dose of PPSV23 vaccine at age 96 years or later if at least 5 years have passed since the previous dose. Doses of PPSV23 are not needed for people immunized with PPSV23 at or after age 55 years.  Meningococcal vaccine. Adults with asplenia or persistent complement component deficiencies should receive 2 doses of quadrivalent meningococcal conjugate (MenACWY-D) vaccine. The doses should be obtained at least 2 months apart. Microbiologists working with certain meningococcal bacteria, [employer] recruits, people at risk during an outbreak, and people who travel to or live in countries with a high rate of meningitis should be immunized. A first-year college student up through age 58 years who is living in a residence hall should receive a dose if she did not receive a dose on or after her 16th birthday. Adults who have certain high-risk conditions should receive one or more doses of vaccine.  Hepatitis A vaccine. Adults who wish to be protected from this disease, have certain high-risk conditions, work with hepatitis A-infected animals, work in hepatitis A research labs, or travel to or work in countries with a high rate of hepatitis A should be  immunized. Adults who were previously unvaccinated and who anticipate close contact with an international adoptee during the first 60 days after arrival in the [place] from a country with a high rate of hepatitis A should be immunized.  Hepatitis B vaccine.  Adults who wish to be protected from this disease, have certain high-risk conditions, may be exposed to blood or other infectious  body fluids, are household contacts or sex partners of hepatitis B positive people, are clients or workers in certain care facilities, or travel to or work in countries with a high rate of hepatitis B should be immunized.  Haemophilus influenzae type b (Hib) vaccine. A previously unvaccinated person with asplenia or sickle cell disease or having a scheduled splenectomy should receive 1 dose of Hib vaccine. Regardless of previous immunization, a recipient of a hematopoietic stem cell transplant should receive a 3-dose series 6 12 months after her successful transplant. Hib vaccine is not recommended for adults with HIV infection.  Preventive Services / Frequency Ages 6 to 39years  Blood pressure check.** / Every 1 to 2 years.  Lipid and cholesterol check.** / Every 5 years beginning at age 39.  Clinical breast exam.** / Every 3 years for women in their 61s and 62s.  BRCA-related cancer risk assessment.** / For women who have family members with a BRCA-related cancer (breast, ovarian, tubal, or peritoneal cancers).  Pap test.** / Every 2 years from ages 47 through 85. Every 3 years starting at age 34 through age 12 or 74 with a history of 3 consecutive normal Pap tests.  HPV screening.** / Every 3 years from ages 46 through ages 43 to 54 with a history of 3 consecutive normal Pap tests.  Hepatitis C blood test.** / For any individual with known risks for hepatitis C.  Skin self-exam. / Monthly.  Influenza vaccine. / Every year.  Tetanus, diphtheria, and acellular pertussis (Tdap, Td) vaccine.** / Consult your health care provider. Pregnant women should receive 1 dose of Tdap vaccine during each pregnancy. 1 dose of Td every 10 years.  Varicella vaccine.** / Consult your health care provider. Pregnant females who do not have evidence of immunity should receive the first dose after pregnancy.  HPV vaccine. / 3 doses over 6 months, if 64 and younger. The vaccine is not recommended for use in  pregnant females. However, pregnancy testing is not needed before receiving a dose.  Measles, mumps, rubella (MMR) vaccine.** / You need at least 1 dose of MMR if you were born in 1957 or later. You may also need a 2nd dose. For females of childbearing age, rubella immunity should be determined. If there is no evidence of immunity, females who are not pregnant should be vaccinated. If there is no evidence of immunity, females who are pregnant should delay immunization until after pregnancy.  Pneumococcal 13-valent conjugate (PCV13) vaccine.** / Consult your health care provider.  Pneumococcal polysaccharide (PPSV23) vaccine.** / 1 to 2 doses if you smoke cigarettes or if you have certain conditions.  Meningococcal vaccine.** / 1 dose if you are age 71 to 35 years and a Market researcher living in a residence hall, or have one of several medical conditions, you need to get vaccinated against meningococcal disease. You may also need additional booster doses.  Hepatitis A vaccine.** / Consult your health care provider.  Hepatitis B vaccine.** / Consult your health care provider.  Haemophilus influenzae type b (Hib) vaccine.** / Consult your health care provider.  Ages 55 to 64years  Blood pressure check.** / Every 1 to 2 years.  Lipid and cholesterol check.** / Every 5 years beginning at age 20 years.  Lung cancer screening. / Every year if you are aged 55 80 years and have a 30-pack-year history of smoking and currently smoke or have quit within the past 15 years. Yearly screening is stopped once you have quit smoking for at least 15 years or develop a health problem that would prevent you from having lung cancer treatment.  Clinical breast exam.** / Every year after age 40 years.  BRCA-related cancer risk assessment.** / For women who have family members with a BRCA-related cancer (breast, ovarian, tubal, or peritoneal cancers).  Mammogram.** / Every year beginning at age 40  years and continuing for as long as you are in good health. Consult with your health care provider.  Pap test.** / Every 3 years starting at age 30 years through age 65 or 70 years with a history of 3 consecutive normal Pap tests.  HPV screening.** / Every 3 years from ages 30 years through ages 65 to 70 years with a history of 3 consecutive normal Pap tests.  Fecal occult blood test (FOBT) of stool. / Every year beginning at age 50 years and continuing until age 75 years. You may not need to do this test if you get a colonoscopy every 10 years.  Flexible sigmoidoscopy or colonoscopy.** / Every 5 years for a flexible sigmoidoscopy or every 10 years for a colonoscopy beginning at age 50 years and continuing until age 75 years.  Hepatitis C blood test.** / For all people born from 1945 through 1965 and any individual with known risks for hepatitis C.  Skin self-exam. / Monthly.  Influenza vaccine. / Every year.  Tetanus, diphtheria, and acellular pertussis (Tdap/Td) vaccine.** / Consult your health care provider. Pregnant women should receive 1 dose of Tdap vaccine during each pregnancy. 1 dose of Td every 10 years.  Varicella vaccine.** / Consult your health care provider. Pregnant females who do not have evidence of immunity should receive the first dose after pregnancy.  Zoster vaccine.** / 1 dose for adults aged 60 years or older.  Measles, mumps, rubella (MMR) vaccine.** / You need at least 1 dose of MMR if you were born in 1957 or later. You may also need a 2nd dose. For females of childbearing age, rubella immunity should be determined. If there is no evidence of immunity, females who are not pregnant should be vaccinated. If there is no evidence of immunity, females who are pregnant should delay immunization until after pregnancy.  Pneumococcal 13-valent conjugate (PCV13) vaccine.** / Consult your health care provider.  Pneumococcal polysaccharide (PPSV23) vaccine.** / 1 to 2 doses if  you smoke cigarettes or if you have certain conditions.  Meningococcal vaccine.** / Consult your health care provider.  Hepatitis A vaccine.** / Consult your health care provider.  Hepatitis B vaccine.** / Consult your health care provider.  Haemophilus influenzae type b (Hib) vaccine.** / Consult your health care provider.  Ages 65 years and over  Blood pressure check.** / Every 1 to 2 years.  Lipid and cholesterol check.** / Every 5 years beginning at age 20 years.  Lung cancer screening. / Every year if you are aged 55 80 years and have a 30-pack-year history of smoking and currently smoke or have quit within the past 15 years. Yearly screening is stopped once you have quit smoking for at least 15 years or develop a health problem that   would prevent you from having lung cancer treatment.  Clinical breast exam.** / Every year after age 103 years.  BRCA-related cancer risk assessment.** / For women who have family members with a BRCA-related cancer (breast, ovarian, tubal, or peritoneal cancers).  Mammogram.** / Every year beginning at age 36 years and continuing for as long as you are in good health. Consult with your health care provider.  Pap test.** / Every 3 years starting at age 5 years through age 85 or 10 years with 3 consecutive normal Pap tests. Testing can be stopped between 65 and 70 years with 3 consecutive normal Pap tests and no abnormal Pap or HPV tests in the past 10 years.  HPV screening.** / Every 3 years from ages 93 years through ages 70 or 45 years with a history of 3 consecutive normal Pap tests. Testing can be stopped between 65 and 70 years with 3 consecutive normal Pap tests and no abnormal Pap or HPV tests in the past 10 years.  Fecal occult blood test (FOBT) of stool. / Every year beginning at age 8 years and continuing until age 45 years. You may not need to do this test if you get a colonoscopy every 10 years.  Flexible sigmoidoscopy or colonoscopy.** /  Every 5 years for a flexible sigmoidoscopy or every 10 years for a colonoscopy beginning at age 69 years and continuing until age 68 years.  Hepatitis C blood test.** / For all people born from [birth year] through 1967 and any individual with known risks for hepatitis C.  Osteoporosis screening.** / A one-time screening for women ages 7 years and over and women at risk for fractures or osteoporosis.  Skin self-exam. / Monthly.  Influenza vaccine. / Every year.  Tetanus, diphtheria, and acellular pertussis (Tdap/Td) vaccine.** / 1 dose of Td every 10 years.  Varicella vaccine.** / Consult your health care provider.  Zoster vaccine.** / 1 dose for adults aged 5 years or older.  Pneumococcal 13-valent conjugate (PCV13) vaccine.** / Consult your health care provider.  Pneumococcal polysaccharide (PPSV23) vaccine.** / 1 dose for all adults aged 74 years and older.  Meningococcal vaccine.** / Consult your health care provider.  Hepatitis A vaccine.** / Consult your health care provider.  Hepatitis B vaccine.** / Consult your health care provider.  Haemophilus influenzae type b (Hib) vaccine.** / Consult your health care provider. ** Family history and personal history of risk and conditions may change your health care provider's recommendations. Document Released: 05/04/2001 Document Revised: 12/27/2012  [facility] Patient Information 2014 [place], [place].   EXERCISE AND DIET:  We recommended that you start or continue a regular exercise program for good health. Regular exercise means any activity that makes your heart beat faster and makes you sweat.  We recommend exercising at least 30 minutes per day at least 3 days a week, preferably 5.  We also recommend a diet low in fat and sugar / carbohydrates.  Inactivity, poor dietary choices and obesity can cause diabetes, heart attack, stroke, and kidney damage, among others.     ALCOHOL AND SMOKING:  Women should limit their alcohol intake to no  more than 7 drinks/beers/glasses of wine (combined, not each!) per week. Moderation of alcohol intake to this level decreases your risk of breast cancer and liver damage.  ( And of course, no recreational drugs are part of a healthy lifestyle.)  Also, you should not be smoking at all or even being exposed to second hand smoke. Most people know smoking can  cause cancer, and various heart and lung diseases, but did you know it also contributes to weakening of your bones?  Aging of your skin?  Yellowing of your teeth and nails?   CALCIUM AND VITAMIN D:  Adequate intake of calcium and Vitamin D are recommended.  The recommendations for exact amounts of these supplements seem to change often, but generally speaking 600 mg of calcium (either carbonate or citrate) and 800 units of Vitamin D per day seems prudent. Certain women may benefit from higher intake of Vitamin D.  If you are among these women, your doctor will have told you during your visit.     PAP SMEARS:  Pap smears, to check for cervical cancer or precancers,  have traditionally been done yearly, although recent scientific advances have shown that most women can have pap smears less often.  However, every woman still should have a physical exam from her gynecologist or primary care physician every year. It will include a breast check, inspection of the vulva and vagina to check for abnormal growths or skin changes, a visual exam of the cervix, and then an exam to evaluate the size and shape of the uterus and ovaries.  And after 36 years of age, a rectal exam is indicated to check for rectal cancers. We will also provide age appropriate advice regarding health maintenance, like when you should have certain vaccines, screening for sexually transmitted diseases, bone density testing, colonoscopy, mammograms, etc.    MAMMOGRAMS:  All women over 71 years old should have a yearly mammogram. Many facilities now offer a "3D" mammogram, which may cost  around $50 extra out of pocket. If possible,  we recommend you accept the option to have the 3D mammogram performed.  It both reduces the number of women who will be called back for extra views which then turn out to be normal, and it is better than the routine mammogram at detecting truly abnormal areas.     COLONOSCOPY:  Colonoscopy to screen for colon cancer is recommended for all women at age 52.  We know, you hate the idea of the prep.  We agree, BUT, having colon cancer and not knowing it is worse!!  Colon cancer so often starts as a polyp that can be seen and removed at colonscopy, which can quite literally save your life!  And if your first colonoscopy is normal and you have no family history of colon cancer, most women don't have to have it again for 10 years.  Once every ten years, you can do something that may end up saving your life, right?  We will be happy to help you get it scheduled when you are ready.  Be sure to check your insurance coverage so you understand how much it will cost.  It may be covered as a preventative service at no cost, but you should check your particular policy.

## 2019-03-29 NOTE — Progress Notes (Signed)
Impression and Recommendations:    1. Encounter for wellness examination   2. Health education/counseling   3. Iron deficiency anemia, unspecified iron deficiency anemia type   4. History of hyperlipidemia   5. Hypertriglyceridemia   6. Elevated LDL cholesterol level   7. Family history of ovarian cancer- PGM   8. BMI 36.0-36.9,adult   9. Rectal pain   10. Hemorrhoids, unspecified hemorrhoid type   11. Elevated blood pressure reading   12. Need for influenza vaccination      1) Anticipatory Guidance: Discussed importance of wearing a seatbelt while driving, not texting while driving; sunscreen when outside along with yearly skin surveillance; eating a well balanced and modest diet; physical activity at least 25 minutes per day or 150 min/ week of moderate to intense activity.  - Prudent self-breast screening reviewed and demonstrated during appointment today.  Advised patient to engage in self-breast checks once monthly.   - BP elevated on intake today.  Reviewed expectations for normal BP with patient today.  Discussed importance of regular ambulatory monitoring and BP log at home.  Follow-up sooner than planned if remains elevated above goal of 130/80    2)  Ongoing GI Symptoms - Rectal Pain, Hemorrhoids - Patient with ongoing GI symptoms, diarrhea, discomfort, and occasional bleeding with stools.  Discussed need for referral to gastroenterology.  Conversation held that in the case of ongoing GI symptoms for months which have not changed, improved, or resolved, patient needs assessment of specialist.  Advised patient that in the case of bloody, painful stool, with changes in consistency, there is a need for rule-out GI pathologic conditions.   - Per patient, she wishes to avoid specialist care due to financial reasons and sx are not that bothersome per pt.  [name].   - Proctofoam prescribed today.  See med list.  -Lengthy discussion held and  all questions answered.   3) Immunizations / Screenings / Labs:   All immunizations and screenings that patient agrees to, are up-to-date per recommendations or will be updated today.  Patient understands the needs for q 10mo dental and yearly vision screens which pt will schedule independently. Obtain CBC, CMP, HgA1c, Lipid panel, TSH and vit D when fasting if not already done recently.   - Last pap smear 03/17/2017, satisfactory and negative, HPV not detected. - Due for next pap December 2021, 3 year repeat.  - No h/o colonoscopy.  - TDAP up to date.  4) Health Education, Counseling - BMI 45.68 Discussed goal of losing even 5-10% of current body weight which would improve overall feelings of well being and improve objective health data significantly.   Improve nutrient density of diet through increasing intake of fruits and vegetables and decreasing saturated/trans fats, white flour products and refined sugar products.   - Advised patient to continue working toward exercising to improve overall mental, physical, and emotional health.    - Reviewed the "spokes of the wheel" of mood and health management.  Stressed the importance of ongoing prudent habits, including regular exercise, appropriate sleep hygiene, healthful dietary habits, and prayer/meditation to relax.  - Encouraged patient to engage in daily physical activity as tolerated, especially a formal exercise routine.  Recommended that the patient eventually strive for at least 150 minutes of moderate cardiovascular activity per week according to guidelines established by the [facility].   - Patient should also consume adequate amounts of water.  - Health counseling performed.  All questions answered.  Covid-19 Counseling - Novel Covid -19 counseling done, including vaccination counseling. All questions were answered.   - Current CDC / federal and [place] guidelines reviewed with patient  - Reminded pt of extreme importance of social  distancing; wearing a mask when out in public; insensate handwashing and cleaning of surfaces, avoiding unnecessary trips for shopping and avoiding ALL but emergency appts etc. - Told patient to be prepared, not scared; and be smart for the sake of others - Patient will continue to monitor the [place] [facility] of [facility] and call with any additional concerns  Recommendations - Return in 6-8 weeks for follow up on GI symptoms and rectal concerns. - Will review BP log at follow-up in 6-8 weeks.     Meds ordered this encounter  Medications  . DISCONTD: pramoxine-hydrocortisone cream    Sig: Apply topically 3 (three) times daily.    Dispense:  57 g    Refill:  0  . hydrocortisone (ANUSOL-HC) 25 MG suppository    Sig: Place 1 suppository (25 mg total) rectally 2 (two) times daily as needed for hemorrhoids.    Dispense:  20 suppository    Refill:  0    Orders Placed This Encounter  Procedures  . Flu Vaccine QUAD 6+ mos PF IM (Fluarix Quad PF)  . CBC w/Diff  . CMP (comprehensive metabolic panel)  . TSH  . T4, free  . Hemoglobin A1c  . Lipid panel  . VITAMIN D 25 Hydroxy (Vit-D Deficiency, Fractures)     Return for 6-8 weeks to follow up on GI symptoms and rectal symptoms, review BP log.   Reminded pt important of f-up preventative CPE in 1 year.  Reminded pt again, this is in addition to any chronic care visits.    Gross side effects, risk and benefits, and alternatives of medications discussed with patient.  Patient is aware that all medications have potential side effects and we are unable to predict every side effect or drug-drug interaction that may occur.  Expresses verbal understanding and consents to current therapy plan and treatment regimen.  F-up preventative CPE in 1 year- reminded pt again, this is in addition to any chronic care visits.    Please see orders placed and AVS handed out to patient at the end of our visit for further patient instructions/ counseling done  pertaining to today's office visit.  This document serves as a record of services personally performed by Mellody Dance, DO. It was created on her behalf by Toni Amend, a trained medical scribe. The creation of this record is based on the scribe's personal observations and the provider's statements to them.   This case required medical decision making of at least moderate complexity. The above documentation has been reviewed to be accurate and was completed by Marjory Sneddon, D.O.     Subjective:    Chief Complaint  Patient presents with  . Annual Exam   CC:   HPI: [name] [name] is a 34 y.o. female who presents to [facility] today a yearly health maintenance exam.  Health Maintenance Summary Reviewed and updated, unless pt declines services.  Colonoscopy:  Denies family history of colon cancer. Tobacco History Reviewed:   Y; non-smoker.  Alcohol:  No concerns, no excessive use. Exercise Habits:  Not meeting AHA guidelines STD concerns:  None. Drug Use:  None. Birth control method:  No concerns, follows with her GYN Menses regular:  No concerns,  follows with her GYN. Lumps or breast concerns:  No concerns, follows with her GYN. Breast Cancer Family History:  Denies family history of breast cancer; notes family history of ovarian cancer.  Notes she does not perform self-breast exams at home because she does not know how. Denies family history of osteoporosis.   Notes she never gets the influenza vaccination.     - Female Health Last pap obtained December of 2018, negative, HPV not detected.   - Eye Health Confirms eye exams once yearly.   - Dental Health Confirms dental visits twice yearly.   - Stress Relief Notes every evening after putting the kids to bed, to help relieve stress, she tries to sit down and do nothing for a couple of hours, just watching TV.  Notes she sits with her dogs.   - GI concerns; periodic  bleeding and discomfort with stools Notes that her symptoms are hard to describe.  Per patient, "most of the time, it's fine, but I will go through periods of a week or so with cramping and diarrhea almost constantly."  Patient notes "we talked about this on the phone last time."  Per patient, experiences bleeding at times when she goes to the bathroom to stool, and "it hurts."  Patient states that at first she thought her symptoms were caused by hemorrhoids, but doesn't really feel hemorrhoids there.    To treat her symptoms, she uses Preparation H with lidocaine, and notes that this helps, but whenever she has symptoms, she has to constantly use Preparation H that week "because it hurts to sit, it hurts to go to the bathroom, just screaming pain."    Says she thinks the bleeding occurs because "something tears," "like little tears on the outside" when she passes stool.  Says there has not been as much blood lately; "the last time it did that was probably a month or two ago.  My husband made me promise I would say something."  Denies feeling lumps or bumps in her rectal area.    Immunization History  Administered Date(s) Administered  . Influenza,inj,Quad PF,6+ Mos 03/29/2019  . Tdap 03/08/2016    Health Maintenance  Topic Date Due  . HIV Screening  09/02/2028 (Originally 11/17/1998)  . PAP SMEAR-Modifier  03/17/2020  . TETANUS/TDAP  03/08/2026  . INFLUENZA VACCINE  Completed     Wt Readings from Last 3 Encounters:  03/29/19 270 lb 4.8 oz (122.6 kg)  11/29/18 269 lb (122 kg)  06/29/18 269 lb 6.4 oz (122.2 kg)   BP Readings from Last 3 Encounters:  03/29/19 129/86  11/29/18 122/83  06/29/18 (!) 142/93   Pulse Readings from Last 3 Encounters:  03/29/19 96  11/29/18 80  06/29/18 86     History reviewed. No pertinent past medical history.    Past Surgical History:  Procedure Laterality Date  . CESAREAN SECTION    . TUBAL LIGATION        Family History  Problem  Relation Age of Onset  . Cancer Maternal Grandmother 43       ovarian  . Diabetes Maternal Grandmother 27      Social History   Substance and Sexual Activity  Drug Use No  ,   Social History   Substance and Sexual Activity  Alcohol Use No  ,   Social History   Tobacco Use  Smoking Status Never Smoker  Smokeless Tobacco Never Used  ,   Social History   Substance and Sexual Activity  Sexual Activity Yes  . Birth control/protection: Surgical    Current Outpatient Medications on File Prior to Visit  Medication Sig Dispense Refill  . busPIRone (BUSPAR) 5 MG tablet Take 1 tablet (5 mg total) by mouth 2 (two) times daily. 180 tablet 1  . Cholecalciferol (VITAMIN D3) 5000 units CAPS Take 1 capsule by mouth daily.    Marland Kitchen escitalopram (LEXAPRO) 20 MG tablet TAKE 1 TABLET BY MOUTH DAILY. 90 tablet 1  . Multiple Vitamin (MULTIVITAMIN) tablet Take 1 tablet by mouth daily.    Marland Kitchen OVER THE COUNTER MEDICATION Ashby Dawes made calm and relax 1 cap daily    . UNABLE TO FIND Take 25 mg by mouth daily. Vegan Iron    . loratadine (CLARITIN) 10 MG tablet Take 10 mg by mouth daily.     No current facility-administered medications on file prior to visit.    Allergies: Patient has no known allergies.  Review of Systems: General:   Denies fever, chills, unexplained weight loss.  Optho/Auditory:   Denies visual changes, blurred vision/LOV Respiratory:   Denies SOB, DOE more than baseline levels.   Cardiovascular:   Denies chest pain, palpitations, new onset peripheral edema  Gastrointestinal:   Denies nausea, vomiting, diarrhea.  Genitourinary: Denies dysuria, freq/ urgency, flank pain or discharge from genitals.  Endocrine:     Denies hot or cold intolerance, polyuria, polydipsia. Musculoskeletal:   Denies unexplained myalgias, joint swelling, unexplained arthralgias, gait problems.  Skin:  Denies rash, suspicious lesions Neurological:     Denies dizziness, unexplained weakness, numbness    Psychiatric/Behavioral:   Denies mood changes, suicidal or homicidal ideations, hallucinations    Objective:    Blood pressure 129/86, pulse 96, temperature 98.1 F (36.7 C), temperature source Oral, resp. rate 12, height 5' 4.5" (1.638 m), weight 270 lb 4.8 oz (122.6 kg), last menstrual period 03/23/2019, SpO2 97 %. Body mass index is 45.68 kg/m. General Appearance:    Alert, cooperative, no distress, appears stated age  Head:    Normocephalic, without obvious abnormality, atraumatic  Eyes:    PERRL, conjunctiva/corneas clear, EOM's intact, fundi    benign, both eyes  Ears:    Normal TM's and external ear canals, both ears  Nose:   Nares normal, septum midline, mucosa normal, no drainage    or sinus tenderness  Throat:   Lips w/o lesion, mucosa moist, and tongue normal; teeth and   gums normal  Neck:   Supple, symmetrical, trachea midline, no adenopathy;    thyroid:  no enlargement/tenderness/nodules; no carotid   bruit or JVD  Back:     Symmetric, no curvature, ROM normal, no CVA tenderness  Lungs:     Clear to auscultation bilaterally, respirations unlabored, no       Wh/ R/ R  Chest Wall:    No tenderness or gross deformity; normal excursion   Heart:    Regular rate and rhythm, S1 and S2 normal, no murmur, rub   or gallop  Breast Exam:     No tenderness, masses, or nipple abnormality b/l; no d/c  Abdomen:     Soft, non-tender, bowel sounds active all four quadrants, NO   G/R/R, no masses, no organomegaly  Genitalia:   Deferred to GYN  Rectal:   external hemorrhoid appreciated at 12 o'clock position, non-thrombosed, not erythematous, non-swollen, with pain.  Patient with moderate amount of tenderness one inch internally into rectum, tenderness to palpation at 3 o'clock position internally.  Patient with additional internal rectal tenderness minimally at  the 12 o'clock position.  Normal tone, no masses, guaiac negative stool  Extremities:   Extremities normal, atraumatic, no  cyanosis or gross edema  Pulses:   2+ and symmetric all extremities  Skin:   Warm, dry, Skin color, texture, turgor normal, no obvious rashes or lesions Psych: No HI/SI, judgement and insight good, Euthymic mood. Full Affect.  Neurologic:   CNII-XII intact, normal strength, sensation and reflexes    Throughout

## 2019-03-29 NOTE — Telephone Encounter (Signed)
Patient was in the office today and was given a prescription for a cream. The pharmacy called her and said that is was not covered by her insurance and will cost $150. The patient wants to know if there is something similar that we can call in that would be covered. Please advise

## 2019-03-29 NOTE — Telephone Encounter (Signed)
Left message letting patient know new med sent to pharmacy and to call back for other information as I was not comfortable leaving this detailed information on voicemail. AS, CMA

## 2019-03-29 NOTE — Telephone Encounter (Signed)
hi

## 2019-03-30 ENCOUNTER — Encounter: Payer: Self-pay | Admitting: Family Medicine

## 2019-03-30 ENCOUNTER — Encounter: Payer: BLUE CROSS/BLUE SHIELD | Admitting: Family Medicine

## 2019-03-30 LAB — COMPREHENSIVE METABOLIC PANEL
ALT: 37 IU/L — ABNORMAL HIGH (ref 0–32)
AST: 24 IU/L (ref 0–40)
Albumin/Globulin Ratio: 1.6 (ref 1.2–2.2)
Albumin: 4.9 g/dL — ABNORMAL HIGH (ref 3.8–4.8)
Alkaline Phosphatase: 68 IU/L (ref 39–117)
BUN/Creatinine Ratio: 19 (ref 9–23)
BUN: 10 mg/dL (ref 6–20)
Bilirubin Total: 0.3 mg/dL (ref 0.0–1.2)
CO2: 25 mmol/L (ref 20–29)
Calcium: 9.9 mg/dL (ref 8.7–10.2)
Chloride: 98 mmol/L (ref 96–106)
Creatinine, Ser: 0.53 mg/dL — ABNORMAL LOW (ref 0.57–1.00)
GFR calc Af Amer: 142 mL/min/{1.73_m2} (ref >59)
GFR calc non Af Amer: 123 mL/min/{1.73_m2} (ref >59)
Globulin, Total: 3 g/dL (ref 1.5–4.5)
Glucose: 95 mg/dL (ref 65–99)
Potassium: 4 mmol/L (ref 3.5–5.2)
Sodium: 140 mmol/L (ref 134–144)
Total Protein: 7.9 g/dL (ref 6.0–8.5)

## 2019-03-30 LAB — LIPID PANEL
Chol/HDL Ratio: 7 ratio — ABNORMAL HIGH (ref 0.0–4.4)
Cholesterol, Total: 209 mg/dL — ABNORMAL HIGH (ref 100–199)
HDL: 30 mg/dL — ABNORMAL LOW
LDL Chol Calc (NIH): 109 mg/dL — ABNORMAL HIGH (ref 0–99)
Triglycerides: 407 mg/dL — ABNORMAL HIGH (ref 0–149)
VLDL Cholesterol Cal: 70 mg/dL — ABNORMAL HIGH (ref 5–40)

## 2019-03-30 LAB — CBC WITH DIFFERENTIAL/PLATELET
Basophils Absolute: 0 10*3/uL (ref 0.0–0.2)
Basos: 1 %
EOS (ABSOLUTE): 0.1 10*3/uL (ref 0.0–0.4)
Eos: 1 %
Hematocrit: 40.8 % (ref 34.0–46.6)
Hemoglobin: 13.5 g/dL (ref 11.1–15.9)
Immature Grans (Abs): 0 10*3/uL (ref 0.0–0.1)
Immature Granulocytes: 0 %
Lymphocytes Absolute: 2.3 10*3/uL (ref 0.7–3.1)
Lymphs: 30 %
MCH: 28.9 pg (ref 26.6–33.0)
MCHC: 33.1 g/dL (ref 31.5–35.7)
MCV: 87 fL (ref 79–97)
Monocytes Absolute: 0.5 10*3/uL (ref 0.1–0.9)
Monocytes: 7 %
Neutrophils Absolute: 4.8 10*3/uL (ref 1.4–7.0)
Neutrophils: 61 %
Platelets: 359 10*3/uL (ref 150–450)
RBC: 4.67 x10E6/uL (ref 3.77–5.28)
RDW: 14 % (ref 11.7–15.4)
WBC: 7.7 10*3/uL (ref 3.4–10.8)

## 2019-03-30 LAB — T4, FREE: Free T4: 1.01 ng/dL (ref 0.82–1.77)

## 2019-03-30 LAB — TSH: TSH: 3.08 u[IU]/mL (ref 0.450–4.500)

## 2019-03-30 LAB — HEMOGLOBIN A1C
Est. average glucose Bld gHb Est-mCnc: 111 mg/dL
Hgb A1c MFr Bld: 5.5 % (ref 4.8–5.6)

## 2019-03-30 LAB — VITAMIN D 25 HYDROXY (VIT D DEFICIENCY, FRACTURES): Vit D, 25-Hydroxy: 54.3 ng/mL (ref 30.0–100.0)

## 2019-04-02 NOTE — Telephone Encounter (Signed)
Please see mychart message. AS, CMA 

## 2019-04-30 ENCOUNTER — Telehealth: Payer: Self-pay | Admitting: Family Medicine

## 2019-04-30 NOTE — Telephone Encounter (Signed)
Patient called states she rcvd a bill from [facility] for FBW chrgs related to CPE--Per [employer] codes are incorrect.  --Forwarding message to Patrici Ranks / Superv for review, Correction & resubmission.  --glh

## 2019-05-25 ENCOUNTER — Ambulatory Visit: Payer: BLUE CROSS/BLUE SHIELD | Admitting: Family Medicine

## 2019-06-14 ENCOUNTER — Other Ambulatory Visit: Payer: Self-pay | Admitting: Family Medicine

## 2019-06-14 DIAGNOSIS — F410 Panic disorder [episodic paroxysmal anxiety] without agoraphobia: Secondary | ICD-10-CM

## 2019-06-14 DIAGNOSIS — F411 Generalized anxiety disorder: Secondary | ICD-10-CM

## 2019-06-14 DIAGNOSIS — F4323 Adjustment disorder with mixed anxiety and depressed mood: Secondary | ICD-10-CM

## 2019-06-14 MED ORDER — ESCITALOPRAM OXALATE 20 MG PO TABS: ORAL_TABLET | ORAL | 1 refills | Status: DC

## 2019-06-14 MED ORDER — BUSPIRONE HCL 5 MG PO TABS: 5.0000 mg | ORAL_TABLET | Freq: Two times a day (BID) | ORAL | 1 refills | Status: DC

## 2019-06-14 NOTE — Telephone Encounter (Signed)
Refill sent to pharmacy. AS, CMA 

## 2019-06-14 NOTE — Telephone Encounter (Signed)
Patient called states she is just about out of these two meds : (request refill)------ ---Pt's LOV was 03/29/2019-----  1)---busPIRone (BUSPAR) 5 MG tablet [[id number]]   Order Details Dose: 5 mg Route: Oral Frequency: 2 times daily  Dispense Quantity: 180 tablet Refills: 1   Indications of Use: Anxiety Disorder      Sig: Take 1 tablet (5 mg total) by mouth 2 (two) times daily.       &   escitalopram (LEXAPRO) 20 MG tablet [[id number]]   Order Details Dose, Route, Frequency: As Directed  Dispense Quantity: 90 tablet Refills: 1       Sig: TAKE 1 TABLET BY MOUTH DAILY.   ---Forwarding request to med asst that if approved send refill to :   [facility] - [place], [place] - [address] [phone] (Phone) [phone] (Fax)   --glh

## 2019-11-09 ENCOUNTER — Ambulatory Visit: Payer: BLUE CROSS/BLUE SHIELD | Admitting: Physician Assistant

## 2019-12-24 ENCOUNTER — Telehealth: Payer: Self-pay | Admitting: Physician Assistant

## 2019-12-24 DIAGNOSIS — F411 Generalized anxiety disorder: Secondary | ICD-10-CM

## 2019-12-24 DIAGNOSIS — F4323 Adjustment disorder with mixed anxiety and depressed mood: Secondary | ICD-10-CM

## 2019-12-24 MED ORDER — ESCITALOPRAM OXALATE 20 MG PO TABS: ORAL_TABLET | ORAL | 0 refills | Status: DC

## 2019-12-24 NOTE — Addendum Note (Signed)
Addended by: Sylvester Harder on: 12/24/2019 12:05 PM   Modules accepted: Orders

## 2019-12-24 NOTE — Telephone Encounter (Signed)
REFILL SENT TO REQUESTED PHARMACY. APT NEEDED FOR FURTHER REFILLS. AS, CMA

## 2019-12-24 NOTE — Telephone Encounter (Signed)
Patient needs a refill on Lexapro. She states she cannot afford to come in office right now for a visit. If you can, please send to [facility] drug.

## 2019-12-25 ENCOUNTER — Encounter: Payer: Self-pay | Admitting: Physician Assistant

## 2020-01-01 DIAGNOSIS — M1712 Unilateral primary osteoarthritis, left knee: Secondary | ICD-10-CM | POA: Diagnosis not present

## 2020-01-22 DIAGNOSIS — M1712 Unilateral primary osteoarthritis, left knee: Secondary | ICD-10-CM | POA: Diagnosis not present

## 2020-02-29 ENCOUNTER — Other Ambulatory Visit: Payer: Self-pay

## 2020-02-29 ENCOUNTER — Ambulatory Visit (INDEPENDENT_AMBULATORY_CARE_PROVIDER_SITE_OTHER): Payer: BC Managed Care – PPO | Admitting: Physician Assistant

## 2020-02-29 ENCOUNTER — Encounter: Payer: Self-pay | Admitting: Physician Assistant

## 2020-02-29 VITALS — BP 121/77 | HR 94 | Temp 98.6°F | Ht 64.5 in | Wt 260.4 lb

## 2020-02-29 DIAGNOSIS — Z807 Family history of other malignant neoplasms of lymphoid, hematopoietic and related tissues: Secondary | ICD-10-CM

## 2020-02-29 DIAGNOSIS — F4323 Adjustment disorder with mixed anxiety and depressed mood: Secondary | ICD-10-CM | POA: Diagnosis not present

## 2020-02-29 DIAGNOSIS — F411 Generalized anxiety disorder: Secondary | ICD-10-CM | POA: Diagnosis not present

## 2020-02-29 MED ORDER — ESCITALOPRAM OXALATE 10 MG PO TABS: 10.0000 mg | ORAL_TABLET | Freq: Every day | ORAL | 0 refills | Status: DC

## 2020-02-29 MED ORDER — ESCITALOPRAM OXALATE 20 MG PO TABS
ORAL_TABLET | ORAL | 0 refills | Status: DC
Start: 2020-02-29 — End: 2020-05-25

## 2020-02-29 NOTE — Progress Notes (Signed)
Established Patient Office Visit  Subjective:  Patient ID: [name] [name], female    DOB: October 09, 1985  Age: 34 y.o. MRN: [id number]  CC:  Chief Complaint  Patient presents with  . Anxiety  . Depression    HPI [name] [name] presents for mood management.  Patient reports the last couple of months has experienced increased personal stress has noticed she has been agitated and exhausted. Her sister was diagnosed with anaplastic large cell lymphoma at the age of 36 and is currently going through chemotherapy. Taking Lexapro 20 mg as directed without issues.  Reports is taking BuSpar 5 mg once daily because she usually forgets to take the second dose.  Has not really noticed a significant change with mood since starting BuSpar so does not feel medication has been effective.  Denies SI/HI.   History reviewed. No pertinent past medical history.  Past Surgical History:  Procedure Laterality Date  . CESAREAN SECTION    . TUBAL LIGATION      Family History  Problem Relation Age of Onset  . Cancer Maternal Grandmother 62       ovarian  . Diabetes Maternal Grandmother 74    Social History   Socioeconomic History  . Marital status: Married    Spouse name: Not on file  . Number of children: Not on file  . Years of education: Not on file  . Highest education level: Not on file  Occupational History  . Not on file  Tobacco Use  . Smoking status: Never Smoker  . Smokeless tobacco: Never Used  Vaping Use  . Vaping Use: Never used  Substance and Sexual Activity  . Alcohol use: No  . Drug use: No  . Sexual activity: Yes    Birth control/protection: Surgical  Other Topics Concern  . Not on file  Social History Narrative  . Not on file   Social Determinants of Health   Financial Resource Strain: Not on file  Food Insecurity: Not on file  Transportation Needs: Not on file  Physical Activity: Not on file  Stress: Not on file  Social Connections: Not on file   Intimate Partner Violence: Not on file    Outpatient Medications Prior to Visit  Medication Sig Dispense Refill  . Cholecalciferol (VITAMIN D3) 5000 units CAPS Take 1 capsule by mouth daily.    Marland Kitchen loratadine (CLARITIN) 10 MG tablet Take 10 mg by mouth daily.    . Multiple Vitamin (MULTIVITAMIN) tablet Take 1 tablet by mouth daily.    Marland Kitchen OVER THE COUNTER MEDICATION Take 25,000 mcg by mouth daily. Biotin with collagen    . Probiotic Product (PHILLIPS COLON HEALTH PO) Take 1 capsule by mouth daily.    Marland Kitchen UNABLE TO FIND Take 25 mg by mouth daily. Vegan Iron    . Zinc 50 MG TABS Take 1 tablet by mouth daily.    . busPIRone (BUSPAR) 5 MG tablet Take 1 tablet (5 mg total) by mouth 2 (two) times daily. 180 tablet 1  . escitalopram (LEXAPRO) 20 MG tablet TAKE 1 TABLET BY MOUTH DAILY.**NEEDS APT FOR FURTHER REFILLS** 60 tablet 0  . hydrocortisone (ANUSOL-HC) 25 MG suppository Place 1 suppository (25 mg total) rectally 2 (two) times daily as needed for hemorrhoids. 20 suppository 0  . OVER THE COUNTER MEDICATION Ashby Dawes made calm and relax 1 cap daily     No facility-administered medications prior to visit.    No Known Allergies  ROS Review of Systems A fourteen  system review of systems was performed and found to be positive as per HPI.   Objective:    Physical Exam General:  Well Developed, well nourished, appropriate for stated age.  Neuro:  Alert and oriented,  extra-ocular muscles intact  HEENT:  Normocephalic, atraumatic, neck supple Skin:  no gross rash, warm, pink. Cardiac:  RRR, S1 S2 Respiratory:  ECTA B/L, Not using accessory muscles, speaking in full sentences- unlabored. Vascular:  Ext warm, no cyanosis apprec.; cap RF less 2 sec. Psych:  No HI/SI, judgement and insight good, Euthymic mood. Full Affect.  BP 121/77   Pulse 94   Temp 98.6 F (37 C) (Oral)   Ht 5' 4.5" (1.638 m)   Wt 260 lb 6.4 oz (118.1 kg)   LMP 02/14/2020 (Exact Date)   SpO2 97% Comment: on RA  BMI 44.01  kg/m  Wt Readings from Last 3 Encounters:  02/29/20 260 lb 6.4 oz (118.1 kg)  03/29/19 270 lb 4.8 oz (122.6 kg)  11/29/18 269 lb (122 kg)     Health Maintenance Due  Topic Date Due  . Hepatitis C Screening  Never done  . COVID-19 Vaccine (1) Never done  . INFLUENZA VACCINE  10/21/2019  . PAP SMEAR-Modifier  03/17/2020    There are no preventive care reminders to display for this patient.  Lab Results  Component Value Date   TSH 3.080 03/29/2019   Lab Results  Component Value Date   WBC 7.7 03/29/2019   HGB 13.5 03/29/2019   HCT 40.8 03/29/2019   MCV 87 03/29/2019   PLT 359 03/29/2019   Lab Results  Component Value Date   NA 140 03/29/2019   K 4.0 03/29/2019   CO2 25 03/29/2019   GLUCOSE 95 03/29/2019   BUN 10 03/29/2019   CREATININE 0.53 (L) 03/29/2019   BILITOT 0.3 03/29/2019   ALKPHOS 68 03/29/2019   AST 24 03/29/2019   ALT 37 (H) 03/29/2019   PROT 7.9 03/29/2019   ALBUMIN 4.9 (H) 03/29/2019   CALCIUM 9.9 03/29/2019   ANIONGAP 7 06/21/2013   Lab Results  Component Value Date   CHOL 209 (H) 03/29/2019   Lab Results  Component Value Date   HDL 30 (L) 03/29/2019   Lab Results  Component Value Date   LDLCALC 109 (H) 03/29/2019   Lab Results  Component Value Date   TRIG 407 (H) 03/29/2019   Lab Results  Component Value Date   CHOLHDL 7.0 (H) 03/29/2019   Lab Results  Component Value Date   HGBA1C 5.5 03/29/2019      Assessment & Plan:   Problem List Items Addressed This Visit      Other   Adjustment disorder with mixed anxiety >er depressed mood (Chronic)   Relevant Medications   escitalopram (LEXAPRO) 10 MG tablet   escitalopram (LEXAPRO) 20 MG tablet   GAD (generalized anxiety disorder)   Relevant Medications   escitalopram (LEXAPRO) 10 MG tablet   escitalopram (LEXAPRO) 20 MG tablet    Other Visit Diagnoses    Family history of lymphoma    -  Primary     Adjustment disorder with mixed anxiety and depressed mood: -PHQ-9 score  of 7, increased from prior. -Discussed with patient medication adjustments and prefers to increase Lexapro to 30 mg and discontinue BuSpar.  Also discussed alternative management options if increased dose of Lexapro fails to improve mood such as switching to a different SSRI or adding another medication such as Wellbutrin. -Recommend to incorporate nonpharmacological  therapy such as mindfulness therapy and relaxation techniques. -Will reassess medication therapy at follow-up appointment in 8 weeks.  GAD: -GAD 7 score, increased from prior. -Will increase Lexapro to 30 mg to help improve mood/anxiety. -Will reassess mood and medication therapy at follow-up appointment in 8 weeks.     Meds ordered this encounter  Medications  . escitalopram (LEXAPRO) 10 MG tablet    Sig: Take 1 tablet (10 mg total) by mouth daily.    Dispense:  90 tablet    Refill:  0    Order Specific Question:   Supervising Provider    Answer:   Nani Gasser D [[id number]]  . escitalopram (LEXAPRO) 20 MG tablet    Sig: TAKE 1 TABLET BY MOUTH DAILY.    Dispense:  90 tablet    Refill:  0    Order Specific Question:   Supervising Provider    Answer:   Nani Gasser D [[id number]]    Follow-up: Return in about 2 months (around 05/01/2020) for CPE and FBW few days prior .   Note:  This note was prepared with assistance of Dragon voice recognition software. Occasional wrong-word or sound-a-like substitutions may have occurred due to the inherent limitations of voice recognition software.   Mayer Masker, PA-C

## 2020-02-29 NOTE — Patient Instructions (Signed)

## 2020-04-18 ENCOUNTER — Other Ambulatory Visit: Payer: Self-pay | Admitting: Physician Assistant

## 2020-04-18 DIAGNOSIS — Z0000 Encounter for general adult medical examination without abnormal findings: Secondary | ICD-10-CM

## 2020-04-18 DIAGNOSIS — E7800 Pure hypercholesterolemia, unspecified: Secondary | ICD-10-CM

## 2020-04-18 DIAGNOSIS — E781 Pure hyperglyceridemia: Secondary | ICD-10-CM

## 2020-04-22 ENCOUNTER — Other Ambulatory Visit: Payer: Self-pay

## 2020-04-22 ENCOUNTER — Other Ambulatory Visit: Payer: BC Managed Care – PPO

## 2020-04-22 DIAGNOSIS — E7800 Pure hypercholesterolemia, unspecified: Secondary | ICD-10-CM | POA: Diagnosis not present

## 2020-04-22 DIAGNOSIS — Z0000 Encounter for general adult medical examination without abnormal findings: Secondary | ICD-10-CM

## 2020-04-22 DIAGNOSIS — E781 Pure hyperglyceridemia: Secondary | ICD-10-CM | POA: Diagnosis not present

## 2020-04-23 LAB — COMPREHENSIVE METABOLIC PANEL
ALT: 21 IU/L (ref 0–32)
AST: 18 IU/L (ref 0–40)
Albumin/Globulin Ratio: 1.9 (ref 1.2–2.2)
Albumin: 4.7 g/dL (ref 3.8–4.8)
Alkaline Phosphatase: 70 IU/L (ref 44–121)
BUN/Creatinine Ratio: 15 (ref 9–23)
BUN: 10 mg/dL (ref 6–20)
Bilirubin Total: <0.2 mg/dL (ref 0.0–1.2)
CO2: 24 mmol/L (ref 20–29)
Calcium: 9.4 mg/dL (ref 8.7–10.2)
Chloride: 99 mmol/L (ref 96–106)
Creatinine, Ser: 0.67 mg/dL (ref 0.57–1.00)
GFR calc Af Amer: 131 mL/min/{1.73_m2} (ref >59)
GFR calc non Af Amer: 113 mL/min/{1.73_m2} (ref >59)
Globulin, Total: 2.5 g/dL (ref 1.5–4.5)
Glucose: 90 mg/dL (ref 65–99)
Potassium: 4.1 mmol/L (ref 3.5–5.2)
Sodium: 139 mmol/L (ref 134–144)
Total Protein: 7.2 g/dL (ref 6.0–8.5)

## 2020-04-23 LAB — TSH: TSH: 2.12 u[IU]/mL (ref 0.450–4.500)

## 2020-04-23 LAB — LIPID PANEL
Chol/HDL Ratio: 6 ratio — ABNORMAL HIGH (ref 0.0–4.4)
Cholesterol, Total: 216 mg/dL — ABNORMAL HIGH (ref 100–199)
HDL: 36 mg/dL — ABNORMAL LOW (ref >39)
LDL Chol Calc (NIH): 144 mg/dL — ABNORMAL HIGH (ref 0–99)
Triglycerides: 200 mg/dL — ABNORMAL HIGH (ref 0–149)
VLDL Cholesterol Cal: 36 mg/dL (ref 5–40)

## 2020-04-23 LAB — CBC
Hematocrit: 39.1 % (ref 34.0–46.6)
Hemoglobin: 12.9 g/dL (ref 11.1–15.9)
MCH: 28.5 pg (ref 26.6–33.0)
MCHC: 33 g/dL (ref 31.5–35.7)
MCV: 87 fL (ref 79–97)
Platelets: 339 10*3/uL (ref 150–450)
RBC: 4.52 x10E6/uL (ref 3.77–5.28)
RDW: 14 % (ref 11.7–15.4)
WBC: 7.7 10*3/uL (ref 3.4–10.8)

## 2020-04-23 LAB — HEMOGLOBIN A1C
Est. average glucose Bld gHb Est-mCnc: 114 mg/dL
Hgb A1c MFr Bld: 5.6 % (ref 4.8–5.6)

## 2020-05-02 ENCOUNTER — Other Ambulatory Visit (HOSPITAL_COMMUNITY)
Admission: RE | Admit: 2020-05-02 | Discharge: 2020-05-02 | Disposition: A | Discharge status: Home / Self Care | Payer: BC Managed Care – PPO | Source: Ambulatory Visit | Attending: Physician Assistant | Admitting: Physician Assistant

## 2020-05-02 ENCOUNTER — Other Ambulatory Visit: Payer: Self-pay

## 2020-05-02 ENCOUNTER — Ambulatory Visit (INDEPENDENT_AMBULATORY_CARE_PROVIDER_SITE_OTHER): Payer: BC Managed Care – PPO | Admitting: Physician Assistant

## 2020-05-02 ENCOUNTER — Encounter: Payer: Self-pay | Admitting: Physician Assistant

## 2020-05-02 VITALS — BP 137/82 | HR 91 | Temp 97.7°F | Ht 64.5 in | Wt 265.7 lb

## 2020-05-02 DIAGNOSIS — G43101 Migraine with aura, not intractable, with status migrainosus: Secondary | ICD-10-CM

## 2020-05-02 DIAGNOSIS — E7800 Pure hypercholesterolemia, unspecified: Secondary | ICD-10-CM

## 2020-05-02 DIAGNOSIS — Z01419 Encounter for gynecological examination (general) (routine) without abnormal findings: Secondary | ICD-10-CM | POA: Diagnosis not present

## 2020-05-02 DIAGNOSIS — Z0000 Encounter for general adult medical examination without abnormal findings: Secondary | ICD-10-CM | POA: Diagnosis not present

## 2020-05-02 DIAGNOSIS — Z30011 Encounter for initial prescription of contraceptive pills: Secondary | ICD-10-CM | POA: Diagnosis not present

## 2020-05-02 DIAGNOSIS — N926 Irregular menstruation, unspecified: Secondary | ICD-10-CM

## 2020-05-02 LAB — POCT URINE PREGNANCY: Preg Test, Ur: NEGATIVE

## 2020-05-02 MED ORDER — PROPRANOLOL HCL 40 MG PO TABS: 40.0000 mg | ORAL_TABLET | Freq: Two times a day (BID) | ORAL | 0 refills | Status: DC

## 2020-05-02 MED ORDER — NORETHIN-ETH ESTRAD-FE BIPHAS 1 MG-10 MCG / 10 MCG PO TABS: 1.0000 | ORAL_TABLET | Freq: Every day | ORAL | 11 refills | Status: DC

## 2020-05-02 NOTE — Progress Notes (Signed)
Female Physical   Impression and Recommendations:    1. Well female exam with routine gynecological exam   2. Healthcare maintenance   3. Migraine with aura and with status migrainosus, not intractable   4. Irregular periods   5. Encounter for initial prescription of contraceptive pills   6. Elevated LDL cholesterol level      1) Anticipatory Guidance: Skin CA prevention- recommend to use sunscreen when outside along with skin surveillance; eating a balanced and modest diet; physical activity at least 25 minutes per day or minimum of 150 min/ week moderate to intense activity.  2) Immunizations / Screenings / Labs:   All immunizations are up-to-date per recommendations or will be updated today if pt allows.    - Patient understands with dental and vision screens they will schedule independently.  - Obtained CBC, CMP, HgA1c, Lipid panel, TSH and vit D when fasting.  Most labs are essentially within normal limits or stable from prior.  Triglycerides have improved, LDL mildly increased from prior. - UTD on Tdap. - Will update pap smear today. - Declined Hep C and HIV screenings, influenza and covid vaccines.  3) Weight:  Recommend to improve diet habits to improve overall feelings of well being and objective health data. Improve nutrient density of diet through increasing intake of fruits and vegetables and decreasing saturated fats, white flour products and refined sugars.  4) Healthcare maintenance: -Continue current medication regimen.  Reports increased Lexapro has been helpful. -Will start oral contraceptive to help with irregular periods (urine pregnancy negative, hx of tubal ligation) and propanolol for preventative therapy for migraines. -Will notify of pap results once available. -Follow a heart healthy diet, increase hydration and stay as active as possible. -Follow-up in 3 months for mood and headaches   Meds ordered this encounter  Medications  . propranolol (INDERAL)  40 MG tablet    Sig: Take 1 tablet (40 mg total) by mouth 2 (two) times daily.    Dispense:  180 tablet    Refill:  0    Order Specific Question:   Supervising Provider    Answer:   Nani Gasser D [[id number]]  . Norethindrone-Ethinyl Estradiol-Fe Biphas (LO LOESTRIN FE) 1 MG-10 MCG / 10 MCG tablet    Sig: Take 1 tablet by mouth daily.    Dispense:  28 tablet    Refill:  11    Order Specific Question:   Supervising Provider    Answer:   Nani Gasser D [[id number]]    Orders Placed This Encounter  Procedures  . POCT urine pregnancy     Return in about 3 months (around 07/30/2020) for Mood, Headaches.      Gross side effects, risk and benefits, and alternatives of medications discussed with patient.  Patient is aware that all medications have potential side effects and we are unable to predict every side effect or drug-drug interaction that may occur.  Expresses verbal understanding and consents to current therapy plan and treatment regimen.  F-up preventative CPE in 1 year-this is in addition to any chronic care visits.    Please see orders placed and AVS handed out to patient at the end of our visit for further patient instructions/ counseling done pertaining to today's office visit.      Subjective:     CPE HPI: [name] [name] is a 35 y.o. female who presents to [facility] [address] today a yearly health maintenance exam.   Health Maintenance Summary  -  Reviewed and updated, unless pt declines services.  Last Cologuard or Colonoscopy:   N/A Family history of Colon CA: Now Tobacco History Reviewed:  Y, never a smoker Alcohol and/or drug use:    No concerns; no use Exercise Habits:   Has no exercise regimen.  Stays busy with taking care of her family as well as her grandmother and other family members. Dental Home: Yes Eye exams: Yes Female Health:  PAP Smear - last known results:  03/17/2017-normal, collecting Pap today. STD concerns:    none Birth control method:  tubaligation Menses regular: Irregular Lumps or breast concerns:  none Breast Cancer Family History:  No   Additional concerns beyond health maintenance issues: Chronic unilateral headaches for the past year, headaches can last for couple days and sometimes has blurred vision before headache onset.  Denies facial numbness or tingling.  Is under significant stress.    Immunization History  Administered Date(s) Administered  . Influenza,inj,Quad PF,6+ Mos 03/29/2019  . Tdap 03/08/2016     Health Maintenance  Topic Date Due  . Hepatitis C Screening  Never done  . COVID-19 Vaccine (1) Never done  . INFLUENZA VACCINE  10/21/2019  . PAP SMEAR-Modifier  03/17/2020  . HIV Screening  09/02/2028 (Originally 11/17/1998)  . TETANUS/TDAP  03/08/2026     Wt Readings from Last 3 Encounters:  05/02/20 265 lb 11.2 oz (120.5 kg)  02/29/20 260 lb 6.4 oz (118.1 kg)  03/29/19 270 lb 4.8 oz (122.6 kg)   BP Readings from Last 3 Encounters:  05/02/20 137/82  02/29/20 121/77  03/29/19 129/86   Pulse Readings from Last 3 Encounters:  05/02/20 91  02/29/20 94  03/29/19 96     History reviewed. No pertinent past medical history.    Past Surgical History:  Procedure Laterality Date  . CESAREAN SECTION    . TUBAL LIGATION        Family History  Problem Relation Age of Onset  . Cancer Maternal Grandmother 5       ovarian  . Diabetes Maternal Grandmother 36      Social History   Substance and Sexual Activity  Drug Use No  ,   Social History   Substance and Sexual Activity  Alcohol Use No  ,   Social History   Tobacco Use  Smoking Status Never Smoker  Smokeless Tobacco Never Used  ,   Social History   Substance and Sexual Activity  Sexual Activity Yes  . Birth control/protection: Surgical    Current Outpatient Medications on File Prior to Visit  Medication Sig Dispense Refill  . Cholecalciferol (VITAMIN D3) 5000 units CAPS  Take 1 capsule by mouth daily.    Marland Kitchen escitalopram (LEXAPRO) 10 MG tablet Take 1 tablet (10 mg total) by mouth daily. 90 tablet 0  . escitalopram (LEXAPRO) 20 MG tablet TAKE 1 TABLET BY MOUTH DAILY. 90 tablet 0  . loratadine (CLARITIN) 10 MG tablet Take 10 mg by mouth daily.    . Multiple Vitamin (MULTIVITAMIN) tablet Take 1 tablet by mouth daily.    Marland Kitchen OVER THE COUNTER MEDICATION Take 25,000 mcg by mouth daily. Biotin with collagen    . Probiotic Product (PHILLIPS COLON HEALTH PO) Take 1 capsule by mouth daily.    Marland Kitchen UNABLE TO FIND Take 25 mg by mouth daily. Vegan Iron    . Zinc 50 MG TABS Take 1 tablet by mouth daily.     No current facility-administered medications on file prior to visit.  Allergies: Patient has no known allergies.  Review of Systems: General:   Denies fever, chills, unexplained weight loss.  Optho/Auditory:   Denies visual changes, blurred vision/LOV Respiratory:   Denies SOB, DOE more than baseline levels.   Cardiovascular:   Denies chest pain, palpitations, new onset peripheral edema  Gastrointestinal:   Denies nausea, vomiting, diarrhea.  Genitourinary: Denies dysuria, freq/ urgency, flank pain  Endocrine:     Denies hot or cold intolerance, polyuria, polydipsia. Musculoskeletal:   Denies unexplained myalgias, joint swelling, unexplained arthralgias, gait problems.  Skin:  Denies rash, suspicious lesions Neurological:     Denies dizziness, unexplained weakness, numbness, + headache Psychiatric/Behavioral:   Denies mood changes, suicidal or homicidal ideations, hallucinations    Objective:    Blood pressure 137/82, pulse 91, temperature 97.7 F (36.5 C), height 5' 4.5" (1.638 m), weight 265 lb 11.2 oz (120.5 kg), SpO2 97 %. Body mass index is 44.9 kg/m. General Appearance:    Alert, cooperative, no distress, appears stated age  Head:    Normocephalic, without obvious abnormality, atraumatic  Eyes:    PERRL, conjunctiva/corneas clear, EOM's intact, both eyes   Ears:    Normal TM's and external ear canals, both ears  Nose:   Nares normal, septum midline, mucosa normal, no drainage    or sinus tenderness  Throat:   Lips w/o lesion, mucosa moist, and tongue normal; teeth and   gums normal  Neck:   Supple, symmetrical, trachea midline, no adenopathy;    thyroid:  no enlargement/tenderness/nodules  Back:     Symmetric, no curvature, ROM normal, no CVA tenderness  Lungs:     Clear to auscultation bilaterally, respirations unlabored, no       Wh/ R/ R  Chest Wall:    No tenderness or gross deformity; normal excursion   Heart:    Regular rate and rhythm, S1 and S2 normal, no murmur, rub   or gallop  Breast Exam:    No tenderness, masses, or nipple abnormality b/l; no d/c  Abdomen:     Soft, non-tender, bowel sounds active all four quadrants, No  G/R/R, no masses, no organomegaly  Genitalia:    Ext genitalia: without lesion, no rash or discharge, No        tenderness;  Cervix: WNL's w/ white and thick discharge or lesion; Adnexa:  No tenderness or palpable masses   Rectal:   Deferred.  No concerns/sxs.  Extremities:   Extremities normal, atraumatic, no cyanosis or gross edema  Pulses:   2+ and symmetric all extremities  Skin:   Warm, dry, Skin color, texture, turgor normal, no obvious rashes or lesions Psych: No HI/SI, judgement and insight good, Euthymic mood. Full Affect.  Neurologic:   CNII-XII grossly intact, normal strength, sensation and reflexes throughout

## 2020-05-02 NOTE — Patient Instructions (Signed)
Preventive Care 21-37 Years Old, Female Preventive care refers to lifestyle choices and visits with your health care provider that can promote health and wellness. This includes:  A yearly physical exam. This is also called an annual wellness visit.  Regular dental and eye exams.  Immunizations.  Screening for certain conditions.  Healthy lifestyle choices, such as: ? Eating a healthy diet. ? Getting regular exercise. ? Not using drugs or products that contain nicotine and tobacco. ? Limiting alcohol use. What can I expect for my preventive care visit? Physical exam Your health care provider may check your:  Height and weight. These may be used to calculate your BMI (body mass index). BMI is a measurement that tells if you are at a healthy weight.  Heart rate and blood pressure.  Body temperature.  Skin for abnormal spots. Counseling Your health care provider may ask you questions about your:  Past medical problems.  Family's medical history.  Alcohol, tobacco, and drug use.  Emotional well-being.  Home life and relationship well-being.  Sexual activity.  Diet, exercise, and sleep habits.  Work and work environment.  Access to firearms.  Method of birth control.  Menstrual cycle.  Pregnancy history. What immunizations do I need? Vaccines are usually given at various ages, according to a schedule. Your health care provider will recommend vaccines for you based on your age, medical history, and lifestyle or other factors, such as travel or where you work.   What tests do I need? Blood tests  Lipid and cholesterol levels. These may be checked every 5 years starting at age 20.  Hepatitis C test.  Hepatitis B test. Screening  Diabetes screening. This is done by checking your blood sugar (glucose) after you have not eaten for a while (fasting).  STD (sexually transmitted disease) testing, if you are at risk.  BRCA-related cancer screening. This may be  done if you have a family history of breast, ovarian, tubal, or peritoneal cancers.  Pelvic exam and Pap test. This may be done every 3 years starting at age 21. Starting at age 30, this may be done every 5 years if you have a Pap test in combination with an HPV test. Talk with your health care provider about your test results, treatment options, and if necessary, the need for more tests.   Follow these instructions at home: Eating and drinking  Eat a healthy diet that includes fresh fruits and vegetables, whole grains, lean protein, and low-fat dairy products.  Take vitamin and mineral supplements as recommended by your health care provider.  Do not drink alcohol if: ? Your health care provider tells you not to drink. ? You are pregnant, may be pregnant, or are planning to become pregnant.  If you drink alcohol: ? Limit how much you have to 0-1 drink a day. ? Be aware of how much alcohol is in your drink. In the [place] one drink equals one 12 oz bottle of beer (355 mL), one 5 oz glass of wine (148 mL), or one 1 oz glass of hard liquor (44 mL).   Lifestyle  Take daily care of your teeth and gums. Brush your teeth every morning and night with fluoride toothpaste. Floss one time each day.  Stay active. Exercise for at least 30 minutes 5 or more days each week.  Do not use any products that contain nicotine or tobacco, such as cigarettes, e-cigarettes, and chewing tobacco. If you need help quitting, ask your health care provider.  Do not   use drugs.  If you are sexually active, practice safe sex. Use a condom or other form of protection to prevent STIs (sexually transmitted infections).  If you do not wish to become pregnant, use a form of birth control. If you plan to become pregnant, see your health care provider for a prepregnancy visit.  Find healthy ways to cope with stress, such as: ? Meditation, yoga, or listening to music. ? Journaling. ? Talking to a trusted  person. ? Spending time with friends and family. Safety  Always wear your seat belt while driving or riding in a vehicle.  Do not drive: ? If you have been drinking alcohol. Do not ride with someone who has been drinking. ? When you are tired or distracted. ? While texting.  Wear a helmet and other protective equipment during sports activities.  If you have firearms in your house, make sure you follow all gun safety procedures.  Seek help if you have been physically or sexually abused. What's next?  Go to your health care provider once a year for an annual wellness visit.  Ask your health care provider how often you should have your eyes and teeth checked.  Stay up to date on all vaccines. This information is not intended to replace advice given to you by your health care provider. Make sure you discuss any questions you have with your health care provider. Document Revised: 11/04/2019 Document Reviewed: 11/17/2017 Elsevier Patient Education  2021 [employer]  

## 2020-05-05 ENCOUNTER — Encounter: Payer: Self-pay | Admitting: Physician Assistant

## 2020-05-05 LAB — CYTOLOGY - PAP: Diagnosis: NEGATIVE

## 2020-05-25 ENCOUNTER — Other Ambulatory Visit: Payer: Self-pay | Admitting: Physician Assistant

## 2020-05-25 DIAGNOSIS — F411 Generalized anxiety disorder: Secondary | ICD-10-CM

## 2020-05-25 DIAGNOSIS — F4323 Adjustment disorder with mixed anxiety and depressed mood: Secondary | ICD-10-CM

## 2020-05-26 MED ORDER — ESCITALOPRAM OXALATE 20 MG PO TABS: ORAL_TABLET | ORAL | 0 refills | Status: DC

## 2020-05-26 MED ORDER — ESCITALOPRAM OXALATE 10 MG PO TABS: 10.0000 mg | ORAL_TABLET | Freq: Every day | ORAL | 0 refills | Status: DC

## 2020-05-30 ENCOUNTER — Encounter: Payer: Self-pay | Admitting: Physician Assistant

## 2020-07-20 ENCOUNTER — Other Ambulatory Visit: Payer: Self-pay | Admitting: Physician Assistant

## 2020-07-21 ENCOUNTER — Other Ambulatory Visit: Payer: Self-pay | Admitting: Physician Assistant

## 2020-07-21 MED ORDER — PROPRANOLOL HCL 40 MG PO TABS: 40.0000 mg | ORAL_TABLET | Freq: Two times a day (BID) | ORAL | 0 refills | Status: DC

## 2020-08-01 ENCOUNTER — Ambulatory Visit (INDEPENDENT_AMBULATORY_CARE_PROVIDER_SITE_OTHER): Payer: BC Managed Care – PPO | Admitting: Physician Assistant

## 2020-08-01 ENCOUNTER — Encounter: Payer: Self-pay | Admitting: Physician Assistant

## 2020-08-01 VITALS — BP 133/75 | HR 75 | Temp 97.4°F | Ht 64.0 in | Wt 269.0 lb

## 2020-08-01 DIAGNOSIS — N926 Irregular menstruation, unspecified: Secondary | ICD-10-CM

## 2020-08-01 DIAGNOSIS — F4323 Adjustment disorder with mixed anxiety and depressed mood: Secondary | ICD-10-CM | POA: Diagnosis not present

## 2020-08-01 DIAGNOSIS — G43101 Migraine with aura, not intractable, with status migrainosus: Secondary | ICD-10-CM

## 2020-08-01 DIAGNOSIS — F411 Generalized anxiety disorder: Secondary | ICD-10-CM | POA: Diagnosis not present

## 2020-08-01 NOTE — Progress Notes (Signed)
Telehealth office visit note for [name] Masker, PA-C- at Primary Care at [facility]   I connected with current patient today by telephone and verified that I am speaking with the correct person   . Location of the patient: Home . Location of the provider: Office - This visit type was conducted due to national recommendations for restrictions regarding the COVID-19 Pandemic (e.g. social distancing) in an effort to limit this patient's exposure and mitigate transmission in our community.    - No physical exam could be performed with this format, beyond that communicated to [place] by the patient/ family members as noted.   - Additionally my office staff/ schedulers were to discuss with the patient that there may be a monetary charge related to this service, depending on their medical insurance.  My understanding is that patient understood and consented to proceed.     _________________________________________________________________________________   History of Present Illness: Patient calls in to follow-up on headaches and mood management.  Patient reports headaches are much better since starting preventative therapy with propanolol.  Has a headache once or twice per week instead of every day.  Will take Tylenol for pain relief which usually resolves her headache.  Denies chest pain, palpitations, fatigue, lightheadedness or dizziness.  Mood: States Lexapro 30 mg is working better, does not feel as overwhelmed and managing stress levels. Has experienced fewer panic attacks which she reports are usually triggered by a situational event such as going to the movie theater. Denies SI/HI.  OCP: Reports medication compliance. States periods have improved, more consistent and has not re-experienced prolonged bleeding like she did when starting medication.    GAD 7 : Generalized Anxiety Score 02/29/2020 06/29/2018 05/09/2018 03/17/2017  Nervous, Anxious, on Edge 2 0 3 1  Control/stop worrying 0 0 2  0  Worry too much - different things 1 0 2 0  Trouble relaxing 2 0 0 1  Restless 0 0 2 0  Easily annoyed or irritable 2 0 2 1  Afraid - awful might happen 0 0 0 0  Total GAD 7 Score 7 0 11 3  Anxiety Difficulty Somewhat difficult Not difficult at all Somewhat difficult Not difficult at all    Depression screen [facility] 2/9 08/01/2020 05/02/2020 02/29/2020 03/29/2019 11/29/2018  Decreased Interest 0 0 0 0 0  Down, Depressed, Hopeless 0 1 1 1  0  PHQ - 2 Score 0 1 1 1  0  Altered sleeping 0 3 2 0 0  Tired, decreased energy 0 3 3 0 1  Change in appetite 0 2 0 1 0  Feeling bad or failure about yourself  0 0 0 0 0  Trouble concentrating 0 1 1 0 0  Moving slowly or fidgety/restless 0 0 0 0 0  Suicidal thoughts 0 0 0 0 0  PHQ-9 Score 0 10 7 2 1   Difficult doing work/chores Not difficult at all - Somewhat difficult Not difficult at all Not difficult at all  Some recent data might be hidden      Impression and Recommendations:     1. Migraine with aura and with status migrainosus, not intractable   2. Adjustment disorder with mixed anxiety >er depressed mood   3. GAD (generalized anxiety disorder)   4. Irregular periods      Migraine with aura and with status migrainosus, not intractable: -Improved.  Will continue current medication regimen.  Ambulatory pulse stable at 75 bpm. -Recommend to avoid potential headache triggers such  as caffeine or chocolate. -Will continue to monitor.  Adjustment disorder with mixed anxiety and depressed mood, GAD: -PHQ-9 score of 0, improved from prior (score of 10). -Continue current medication regimen. -Will continue to monitor.  Irregular periods: -Improved. Continue current medication regimen. -Will continue to monitor. -Continue to abstain from tobacco use.  - As part of my medical decision making, I reviewed the following data within the electronic MEDICAL RECORD NUMBER History obtained from pt /family, CMA notes reviewed and incorporated if applicable,  Labs reviewed, Radiograph/ tests reviewed if applicable and OV notes from prior OV's with me, as well as any other specialists she/he has seen since seeing me last, were all reviewed and used in my medical decision making process today.    - Additionally, when appropriate, discussion had with patient regarding our treatment plan, and their biases/concerns about that plan were used in my medical decision making today.    - The patient agreed with the plan and demonstrated an understanding of the instructions.   No barriers to understanding were identified.     - The patient was advised to call back or seek an in-person evaluation if the symptoms worsen or if the condition fails to improve as anticipated.   Return in about 3 months (around 11/01/2020) for Mood, Headache.    No orders of the defined types were placed in this encounter.   No orders of the defined types were placed in this encounter.   There are no discontinued medications.     Time spent on telephone encounter was 6 minutes.   Note:  This note was prepared with assistance of Dragon voice recognition software. Occasional wrong-word or sound-a-like substitutions may have occurred due to the inherent limitations of voice recognition software.    The 21st Century Cures Act was signed into law in 2016 which includes the topic of electronic health records.  This provides immediate access to information in MyChart.  This includes consultation notes, operative notes, office notes, lab results and pathology reports.  If you have any questions about what you read please let us know at your next visit or call us at the office.  We are right here with you.   __________________________________________________________________________________     Patient Care Team    Relationship Specialty Notifications Start End  [name] [name], [place] PCP - General   07/22/19   Lunette Stands, MD Consulting Physician Orthopedic Surgery  01/22/16    Vena Austria, MD Referring Physician Obstetrics and Gynecology  01/22/16   Blima Ledger, OD  Optometry  01/22/16      -Vitals obtained; medications/ allergies reconciled;  personal medical, social, Sx etc.histories were updated by CMA, reviewed by me and are reflected in chart   Patient Active Problem List   Diagnosis Date Noted  . Rectal pain 03/29/2019  . Hemorrhoids 03/29/2019  . Elevated blood pressure reading 03/29/2019  . Panic disorder 11/29/2018  . Elevated LDL cholesterol level 06/29/2018  . GAD (generalized anxiety disorder) 06/29/2018  . Family history of ovarian cancer- PGM 03/17/2017  . Tinea corporis- pannus of abd 09/02/2016  . Hypertriglyceridemia 03/08/2016  . h/o Iron deficiency anemia 01/22/2016  . Adjustment disorder with mixed anxiety >er depressed mood 01/22/2016  . Environmental and seasonal allergies 01/22/2016  . Situational anxiety 01/22/2016  . History of hyperlipidemia 01/22/2016  . BMI 36.0-36.9,adult 01/22/2016     Current Meds  Medication Sig  . Cholecalciferol (VITAMIN D3) 5000 units CAPS Take 1 capsule by mouth daily.  Marland Kitchen escitalopram (  LEXAPRO) 10 MG tablet Take 1 tablet (10 mg total) by mouth daily.  Marland Kitchen escitalopram (LEXAPRO) 20 MG tablet TAKE 1 TABLET BY MOUTH DAILY.  Marland Kitchen loratadine (CLARITIN) 10 MG tablet Take 10 mg by mouth daily.  . Multiple Vitamin (MULTIVITAMIN) tablet Take 1 tablet by mouth daily.  . Norethindrone-Ethinyl Estradiol-Fe Biphas (LO LOESTRIN FE) 1 MG-10 MCG / 10 MCG tablet Take 1 tablet by mouth daily.  Marland Kitchen OVER THE COUNTER MEDICATION Take 25,000 mcg by mouth daily. Biotin with collagen  . Probiotic Product (PHILLIPS COLON HEALTH PO) Take 1 capsule by mouth daily.  . propranolol (INDERAL) 40 MG tablet Take 1 tablet (40 mg total) by mouth 2 (two) times daily.  Marland Kitchen UNABLE TO FIND Take 25 mg by mouth daily. Vegan Iron  . Zinc 50 MG TABS Take 1 tablet by mouth daily.     Allergies:  No Known Allergies   ROS:  See above  HPI for pertinent positives and negatives   Objective:   Blood pressure 133/75, pulse 75, temperature (!) 97.4 F (36.3 C), height 5\' 4"  (1.626 m), weight 269 lb (122 kg), last menstrual period 07/24/2020.  (if some vitals are omitted, this means that patient was UNABLE to obtain them.) General: A & O * 3; sounds in no acute distress; in usual state of health.  Respiratory: speaking in full sentences, no conversational dyspnea Psych: insight appears good, mood- appears full

## 2020-08-01 NOTE — Patient Instructions (Signed)

## 2020-08-25 ENCOUNTER — Other Ambulatory Visit: Payer: Self-pay | Admitting: Physician Assistant

## 2020-08-25 DIAGNOSIS — F4323 Adjustment disorder with mixed anxiety and depressed mood: Secondary | ICD-10-CM

## 2020-08-25 DIAGNOSIS — F411 Generalized anxiety disorder: Secondary | ICD-10-CM

## 2020-09-11 ENCOUNTER — Encounter: Payer: Self-pay | Admitting: Physician Assistant

## 2020-09-12 ENCOUNTER — Other Ambulatory Visit: Payer: Self-pay | Admitting: Nurse Practitioner

## 2020-09-12 DIAGNOSIS — Z30011 Encounter for initial prescription of contraceptive pills: Secondary | ICD-10-CM

## 2020-09-12 MED ORDER — NORETHIN ACE-ETH ESTRAD-FE 1.5-30 MG-MCG PO TABS: 1.0000 | ORAL_TABLET | Freq: Every day | ORAL | 11 refills | Status: DC

## 2020-09-12 NOTE — Progress Notes (Signed)
Changed hormone to just Lo Estrin rather thatn L loestrin. This increases her dosing from 1/10mg  to 1.5/30mg  every day with active pills. I have sent this to [facility]. She should start this new pill when she should start new pill pack.

## 2020-09-12 NOTE — Telephone Encounter (Signed)
Please let the patient know that I changed hormone to just Lo Estrin rather thatn L loestrin. This increases her dosing from 1/10mg  to 1.5/30mg  every day with active pills. I have sent this to [facility]. She should start this new pill when she should start new pill pack.  Thanks so much.  HB

## 2020-09-21 ENCOUNTER — Other Ambulatory Visit: Payer: Self-pay | Admitting: Physician Assistant

## 2020-09-21 DIAGNOSIS — F411 Generalized anxiety disorder: Secondary | ICD-10-CM

## 2020-09-21 DIAGNOSIS — F4323 Adjustment disorder with mixed anxiety and depressed mood: Secondary | ICD-10-CM

## 2020-09-23 ENCOUNTER — Other Ambulatory Visit: Payer: Self-pay | Admitting: Physician Assistant

## 2020-09-23 DIAGNOSIS — F4323 Adjustment disorder with mixed anxiety and depressed mood: Secondary | ICD-10-CM

## 2020-09-23 DIAGNOSIS — F411 Generalized anxiety disorder: Secondary | ICD-10-CM

## 2020-09-23 MED ORDER — ESCITALOPRAM OXALATE 10 MG PO TABS: 10.0000 mg | ORAL_TABLET | Freq: Every day | ORAL | 0 refills | Status: DC

## 2020-09-23 MED ORDER — PROPRANOLOL HCL 40 MG PO TABS: 40.0000 mg | ORAL_TABLET | Freq: Two times a day (BID) | ORAL | 0 refills | Status: DC

## 2020-09-23 MED ORDER — ESCITALOPRAM OXALATE 20 MG PO TABS: 20.0000 mg | ORAL_TABLET | Freq: Every day | ORAL | 0 refills | Status: DC

## 2020-10-22 ENCOUNTER — Other Ambulatory Visit: Payer: Self-pay | Admitting: Physician Assistant

## 2020-10-22 ENCOUNTER — Encounter: Payer: Self-pay | Admitting: Physician Assistant

## 2020-10-22 DIAGNOSIS — F411 Generalized anxiety disorder: Secondary | ICD-10-CM

## 2020-10-22 DIAGNOSIS — F4323 Adjustment disorder with mixed anxiety and depressed mood: Secondary | ICD-10-CM

## 2020-10-22 NOTE — Telephone Encounter (Signed)
Patient was responded to by a [facility] message.

## 2020-10-22 NOTE — Telephone Encounter (Signed)
Ben,   Please reach out to the patient to schedule a follow up per last AVS.   Respectfully,  Winnell Bento  

## 2020-11-12 DIAGNOSIS — F419 Anxiety disorder, unspecified: Secondary | ICD-10-CM | POA: Diagnosis not present

## 2020-11-12 DIAGNOSIS — G43001 Migraine without aura, not intractable, with status migrainosus: Secondary | ICD-10-CM | POA: Diagnosis not present

## 2020-11-28 ENCOUNTER — Ambulatory Visit: Payer: BC Managed Care – PPO | Admitting: Physician Assistant

## 2020-12-09 DIAGNOSIS — G43001 Migraine without aura, not intractable, with status migrainosus: Secondary | ICD-10-CM | POA: Diagnosis not present

## 2021-01-20 DIAGNOSIS — F419 Anxiety disorder, unspecified: Secondary | ICD-10-CM | POA: Diagnosis not present

## 2021-01-20 DIAGNOSIS — G43001 Migraine without aura, not intractable, with status migrainosus: Secondary | ICD-10-CM | POA: Diagnosis not present

## 2021-05-29 DIAGNOSIS — E0590 Thyrotoxicosis, unspecified without thyrotoxic crisis or storm: Secondary | ICD-10-CM | POA: Diagnosis not present

## 2021-07-16 DIAGNOSIS — Z0000 Encounter for general adult medical examination without abnormal findings: Secondary | ICD-10-CM | POA: Diagnosis not present

## 2021-07-17 DIAGNOSIS — Z0000 Encounter for general adult medical examination without abnormal findings: Secondary | ICD-10-CM | POA: Diagnosis not present

## 2021-07-21 DIAGNOSIS — T564X1A Toxic effect of copper and its compounds, accidental (unintentional), initial encounter: Secondary | ICD-10-CM | POA: Diagnosis not present

## 2021-07-21 DIAGNOSIS — Z0000 Encounter for general adult medical examination without abnormal findings: Secondary | ICD-10-CM | POA: Diagnosis not present

## 2021-07-21 DIAGNOSIS — F419 Anxiety disorder, unspecified: Secondary | ICD-10-CM | POA: Diagnosis not present

## 2021-07-21 DIAGNOSIS — E0590 Thyrotoxicosis, unspecified without thyrotoxic crisis or storm: Secondary | ICD-10-CM | POA: Diagnosis not present

## 2021-07-21 DIAGNOSIS — L309 Dermatitis, unspecified: Secondary | ICD-10-CM | POA: Diagnosis not present

## 2021-08-11 DIAGNOSIS — Z01419 Encounter for gynecological examination (general) (routine) without abnormal findings: Secondary | ICD-10-CM | POA: Diagnosis not present

## 2021-08-11 DIAGNOSIS — Z3041 Encounter for surveillance of contraceptive pills: Secondary | ICD-10-CM | POA: Diagnosis not present

## 2021-08-11 DIAGNOSIS — L309 Dermatitis, unspecified: Secondary | ICD-10-CM | POA: Diagnosis not present

## 2021-09-15 DIAGNOSIS — E0590 Thyrotoxicosis, unspecified without thyrotoxic crisis or storm: Secondary | ICD-10-CM | POA: Diagnosis not present

## 2021-11-13 DIAGNOSIS — E538 Deficiency of other specified B group vitamins: Secondary | ICD-10-CM | POA: Diagnosis not present

## 2021-12-04 DIAGNOSIS — E538 Deficiency of other specified B group vitamins: Secondary | ICD-10-CM | POA: Diagnosis not present

## 2022-07-13 DIAGNOSIS — Z3041 Encounter for surveillance of contraceptive pills: Secondary | ICD-10-CM | POA: Diagnosis not present

## 2022-07-13 DIAGNOSIS — M67472 Ganglion, left ankle and foot: Secondary | ICD-10-CM | POA: Diagnosis not present

## 2022-07-16 DIAGNOSIS — M25572 Pain in left ankle and joints of left foot: Secondary | ICD-10-CM | POA: Diagnosis not present

## 2022-07-22 DIAGNOSIS — Z0000 Encounter for general adult medical examination without abnormal findings: Secondary | ICD-10-CM | POA: Diagnosis not present

## 2022-07-22 DIAGNOSIS — E0590 Thyrotoxicosis, unspecified without thyrotoxic crisis or storm: Secondary | ICD-10-CM | POA: Diagnosis not present

## 2022-07-27 DIAGNOSIS — Z0000 Encounter for general adult medical examination without abnormal findings: Secondary | ICD-10-CM | POA: Diagnosis not present

## 2022-07-27 DIAGNOSIS — E538 Deficiency of other specified B group vitamins: Secondary | ICD-10-CM | POA: Diagnosis not present

## 2022-07-27 DIAGNOSIS — F419 Anxiety disorder, unspecified: Secondary | ICD-10-CM | POA: Diagnosis not present

## 2022-07-27 DIAGNOSIS — G43001 Migraine without aura, not intractable, with status migrainosus: Secondary | ICD-10-CM | POA: Diagnosis not present

## 2022-07-27 DIAGNOSIS — E0590 Thyrotoxicosis, unspecified without thyrotoxic crisis or storm: Secondary | ICD-10-CM | POA: Diagnosis not present

## 2022-07-30 DIAGNOSIS — M67472 Ganglion, left ankle and foot: Secondary | ICD-10-CM | POA: Diagnosis not present

## 2022-07-30 DIAGNOSIS — F32A Depression, unspecified: Secondary | ICD-10-CM | POA: Diagnosis not present

## 2022-07-30 DIAGNOSIS — Z3041 Encounter for surveillance of contraceptive pills: Secondary | ICD-10-CM | POA: Diagnosis not present

## 2022-08-17 DIAGNOSIS — Z3041 Encounter for surveillance of contraceptive pills: Secondary | ICD-10-CM | POA: Diagnosis not present

## 2022-08-17 DIAGNOSIS — Z01419 Encounter for gynecological examination (general) (routine) without abnormal findings: Secondary | ICD-10-CM | POA: Diagnosis not present

## 2022-12-21 ENCOUNTER — Other Ambulatory Visit (HOSPITAL_COMMUNITY): Payer: Self-pay

## 2022-12-21 ENCOUNTER — Other Ambulatory Visit: Payer: Self-pay

## 2022-12-21 DIAGNOSIS — R030 Elevated blood-pressure reading, without diagnosis of hypertension: Secondary | ICD-10-CM | POA: Diagnosis not present

## 2022-12-21 DIAGNOSIS — Z6835 Body mass index (BMI) 35.0-35.9, adult: Secondary | ICD-10-CM | POA: Diagnosis not present

## 2022-12-21 MED ORDER — ZEPBOUND 5 MG/0.5ML ~~LOC~~ SOAJ
5.0000 mg | SUBCUTANEOUS | 0 refills | Status: DC
  Filled 2023-01-12: qty 2, 28d supply, fill #0

## 2022-12-21 MED ORDER — ZEPBOUND 7.5 MG/0.5ML ~~LOC~~ SOAJ
7.5000 mg | SUBCUTANEOUS | 0 refills | Status: DC
  Filled 2023-02-09 – 2023-02-11 (×2): qty 2, 28d supply, fill #0

## 2022-12-21 MED ORDER — ZEPBOUND 10 MG/0.5ML ~~LOC~~ SOAJ
10.0000 mg | SUBCUTANEOUS | 0 refills | Status: DC
  Filled 2023-03-09: qty 2, 28d supply, fill #0

## 2022-12-21 MED ORDER — ZEPBOUND 2.5 MG/0.5ML ~~LOC~~ SOAJ
2.5000 mg | SUBCUTANEOUS | 0 refills | Status: DC
  Filled 2022-12-21: qty 2, 28d supply, fill #0

## 2023-01-12 ENCOUNTER — Other Ambulatory Visit: Payer: Self-pay

## 2023-01-12 ENCOUNTER — Other Ambulatory Visit (HOSPITAL_COMMUNITY): Payer: Self-pay

## 2023-01-13 ENCOUNTER — Other Ambulatory Visit (HOSPITAL_COMMUNITY): Payer: Self-pay

## 2023-01-13 MED ORDER — ZEPBOUND 2.5 MG/0.5ML ~~LOC~~ SOAJ
SUBCUTANEOUS | 11 refills | Status: DC
  Filled 2023-01-13 – 2023-07-25 (×2): qty 2, 28d supply, fill #0

## 2023-01-14 ENCOUNTER — Other Ambulatory Visit (HOSPITAL_COMMUNITY): Payer: Self-pay

## 2023-02-09 ENCOUNTER — Other Ambulatory Visit (HOSPITAL_COMMUNITY): Payer: Self-pay

## 2023-02-11 ENCOUNTER — Other Ambulatory Visit: Payer: Self-pay

## 2023-02-11 ENCOUNTER — Other Ambulatory Visit (HOSPITAL_BASED_OUTPATIENT_CLINIC_OR_DEPARTMENT_OTHER): Payer: Self-pay

## 2023-02-11 ENCOUNTER — Other Ambulatory Visit (HOSPITAL_COMMUNITY): Payer: Self-pay

## 2023-03-09 ENCOUNTER — Other Ambulatory Visit (HOSPITAL_COMMUNITY): Payer: Self-pay

## 2023-07-25 ENCOUNTER — Other Ambulatory Visit (HOSPITAL_COMMUNITY): Payer: Self-pay

## 2023-07-26 DIAGNOSIS — E0590 Thyrotoxicosis, unspecified without thyrotoxic crisis or storm: Secondary | ICD-10-CM | POA: Diagnosis not present

## 2023-07-26 DIAGNOSIS — Z0000 Encounter for general adult medical examination without abnormal findings: Secondary | ICD-10-CM | POA: Diagnosis not present

## 2023-07-27 ENCOUNTER — Other Ambulatory Visit (HOSPITAL_COMMUNITY): Payer: Self-pay

## 2023-07-27 MED ORDER — WEGOVY 0.25 MG/0.5ML ~~LOC~~ SOAJ
0.2500 mg | SUBCUTANEOUS | 0 refills | Status: DC
  Filled 2023-07-27: qty 2, 28d supply, fill #0

## 2023-07-29 DIAGNOSIS — F419 Anxiety disorder, unspecified: Secondary | ICD-10-CM | POA: Diagnosis not present

## 2023-07-29 DIAGNOSIS — Z0000 Encounter for general adult medical examination without abnormal findings: Secondary | ICD-10-CM | POA: Diagnosis not present

## 2023-08-16 DIAGNOSIS — E559 Vitamin D deficiency, unspecified: Secondary | ICD-10-CM | POA: Diagnosis not present

## 2023-08-16 DIAGNOSIS — Z01419 Encounter for gynecological examination (general) (routine) without abnormal findings: Secondary | ICD-10-CM | POA: Diagnosis not present

## 2023-09-19 ENCOUNTER — Other Ambulatory Visit (HOSPITAL_COMMUNITY): Payer: Self-pay

## 2023-11-04 ENCOUNTER — Other Ambulatory Visit: Payer: Self-pay | Admitting: Internal Medicine

## 2023-11-04 DIAGNOSIS — Z1231 Encounter for screening mammogram for malignant neoplasm of breast: Secondary | ICD-10-CM

## 2023-11-29 ENCOUNTER — Ambulatory Visit
Admission: RE | Admit: 2023-11-29 | Discharge: 2023-11-29 | Disposition: A | Discharge status: Home / Self Care | Source: Ambulatory Visit | Attending: Internal Medicine | Admitting: Internal Medicine

## 2023-11-29 DIAGNOSIS — Z1231 Encounter for screening mammogram for malignant neoplasm of breast: Secondary | ICD-10-CM

## 2024-02-15 ENCOUNTER — Ambulatory Visit (INDEPENDENT_AMBULATORY_CARE_PROVIDER_SITE_OTHER)

## 2024-02-15 VITALS — BP 137/84 | HR 96 | Temp 98.2°F | Ht 64.0 in | Wt 237.0 lb

## 2024-02-15 DIAGNOSIS — Z6841 Body Mass Index (BMI) 40.0 and over, adult: Secondary | ICD-10-CM | POA: Insufficient documentation

## 2024-02-15 DIAGNOSIS — J3089 Other allergic rhinitis: Secondary | ICD-10-CM | POA: Diagnosis not present

## 2024-02-15 DIAGNOSIS — Z30011 Encounter for initial prescription of contraceptive pills: Secondary | ICD-10-CM

## 2024-02-15 DIAGNOSIS — F411 Generalized anxiety disorder: Secondary | ICD-10-CM | POA: Diagnosis not present

## 2024-02-15 DIAGNOSIS — N946 Dysmenorrhea, unspecified: Secondary | ICD-10-CM | POA: Diagnosis not present

## 2024-02-15 DIAGNOSIS — D508 Other iron deficiency anemias: Secondary | ICD-10-CM | POA: Diagnosis not present

## 2024-02-15 DIAGNOSIS — Z8639 Personal history of other endocrine, nutritional and metabolic disease: Secondary | ICD-10-CM

## 2024-02-15 DIAGNOSIS — G43009 Migraine without aura, not intractable, without status migrainosus: Secondary | ICD-10-CM

## 2024-02-15 DIAGNOSIS — G43909 Migraine, unspecified, not intractable, without status migrainosus: Secondary | ICD-10-CM | POA: Insufficient documentation

## 2024-02-15 DIAGNOSIS — Z1159 Encounter for screening for other viral diseases: Secondary | ICD-10-CM

## 2024-02-15 MED ORDER — NURTEC 75 MG PO TBDP: 75.0000 mg | ORAL_TABLET | ORAL | 0 refills | Status: DC

## 2024-02-15 MED ORDER — ESCITALOPRAM OXALATE 10 MG PO TABS: 10.0000 mg | ORAL_TABLET | Freq: Every day | ORAL | 2 refills | Status: AC

## 2024-02-15 MED ORDER — BUPROPION HCL ER (XL) 300 MG PO TB24: 300.0000 mg | ORAL_TABLET | Freq: Every day | ORAL | 3 refills | Status: AC

## 2024-02-15 MED ORDER — NORETHIN ACE-ETH ESTRAD-FE 1.5-30 MG-MCG PO TABS: 1.0000 | ORAL_TABLET | Freq: Every day | ORAL | 11 refills | Status: AC

## 2024-02-15 NOTE — Assessment & Plan Note (Signed)
 Stable. Cont claritin daily prn

## 2024-02-15 NOTE — Assessment & Plan Note (Signed)
 Lost 85 pounds with Zepbound  in clinical trial. Insurance may cover GLP-1 therapy in January. Discussed potential use of Zepbound  or Wegovy . - Will initiate GLP-1 therapy in January, pending insurance approval. - Will contact office in January to confirm insurance coverage and initiate therapy. - In the meantime, continue with well balanced calorie-deficit diet and increased physical activity to maintain current weight/aid in weight loss.

## 2024-02-15 NOTE — Progress Notes (Signed)
 New Patient Office Visit  Subjective    Patient ID: [name] [name], female    DOB: [birth year]  Age: 38 y.o. MRN: [id number]  CC:  Chief Complaint  Patient presents with   New Patient (Initial Visit)    Established Care    History of Present Illness    [name] [name] is a 38 year old female who presents for medication management and reestablishment of care. Previous PCP was Dr. Ryan Hives at [facility]. Last CPE on 07/29/23.  She is overall feeling well, sleeping well. Regular bowel movements and regular periods. She has 3 children, married. 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   Mood disturbances and sleep difficulty - Reports mood disturbances since discontinuing Lexapro  two weeks ago due to running out of medication - Currently taking Wellbutrin  150 mg once daily  - Difficulty falling asleep attributed to mood disturbances since running out of Lexapro    Menstrual regulation - Uses Loestrin Fe for menstrual regulation, not for contraception as she has hx of tubal ligation - Satisfied with menstrual control currently   Migraine headaches - Uses Nurtec ODT every other day for migraine prevention and as needed for acute attacks - Experiences one to two migraines per month - Marked improvement from previous constant headache prior to starting Nurtec  Allergic rhinitis - History of seasonal allergies - Uses Claritin daily for allergy management  Iron deficiency - Takes daily iron supplement (vegetable-based form) - No constipation associated with iron supplementation  Hyperlipidemia - History of elevated cholesterol noted at last physical - Not currently on medication for hyperlipidemia; treatment not required at last assessment  Vitamin supplementation - Takes vitamin D3 5000 IU, B12 1000 mcg, and B complex daily      Screenings:  Colon Cancer: NA; denies fam hx Lung Cancer: NA; nonsmoker  Breast  Cancer: UTD  Cervical Cancer: Last pap performed May 27th 2025; will request records from prev PCP  Diabetes: Will check A1c with FBW at CPE  HLD: Will check lipid panel with [facility] at CPE  The ASCVD Risk score (Arnett DK, et al., 2019) failed to calculate for the following reasons:   Cannot find a previous HDL lab   Cannot find a previous total cholesterol lab  Outpatient Encounter Medications as of 02/15/2024  Medication Sig   b complex vitamins capsule Take 1 capsule by mouth daily.   buPROPion  (WELLBUTRIN  XL) 300 MG 24 hr tablet Take 1 tablet (300 mg total) by mouth daily.   Cholecalciferol (VITAMIN D3) 5000 units CAPS Take 1 capsule by mouth daily.   cyanocobalamin (VITAMIN B12) 1000 MCG tablet Take 1,000 mcg by mouth daily.   ferrous sulfate 325 (65 FE) MG tablet Take 325 mg by mouth daily with breakfast.   loratadine (CLARITIN) 10 MG tablet Take 10 mg by mouth daily.   Rimegepant Sulfate (NURTEC) 75 MG TBDP Take 1 tablet (75 mg total) by mouth every other day.   [DISCONTINUED] escitalopram  (LEXAPRO ) 10 MG tablet TAKE 1 TABLET (10 MG TOTAL) BY MOUTH DAILY.   [DISCONTINUED] escitalopram  (LEXAPRO ) 20 MG tablet TAKE 1 TABLET (20 MG TOTAL) BY MOUTH DAILY.   [DISCONTINUED] Multiple Vitamin (MULTIVITAMIN) tablet Take 1 tablet by mouth daily.   [DISCONTINUED] norethindrone-ethinyl estradiol-iron (LOESTRIN FE 1.5/30) 1.5-30 MG-MCG tablet Take 1 tablet by mouth daily.   [DISCONTINUED] OVER THE COUNTER MEDICATION Take 25,000 mcg by mouth daily. Biotin with collagen   [DISCONTINUED] Probiotic Product (PHILLIPS COLON  HEALTH PO) Take 1 capsule by mouth daily.   [DISCONTINUED] propranolol  (INDERAL ) 40 MG tablet Take 1 tablet (40 mg total) by mouth 2 (two) times daily.   [DISCONTINUED] Semaglutide -Weight Management (WEGOVY ) 0.25 MG/0.5ML SOAJ Inject 0.25 mg into the skin once a week.   [DISCONTINUED] tirzepatide  (ZEPBOUND ) 10 MG/0.5ML Pen Inject 10 mg into the skin once a week.   [DISCONTINUED]  tirzepatide  (ZEPBOUND ) 2.5 MG/0.5ML Pen Inject 2.5 mg into the skin once a week.   [DISCONTINUED] tirzepatide  (ZEPBOUND ) 5 MG/0.5ML Pen Inject 5 mg into the skin once a week.   [DISCONTINUED] tirzepatide  (ZEPBOUND ) 7.5 MG/0.5ML Pen Inject 7.5 mg into the skin once a week.   [DISCONTINUED] Zinc 50 MG TABS Take 1 tablet by mouth daily.   escitalopram  (LEXAPRO ) 10 MG tablet Take 1 tablet (10 mg total) by mouth daily.   norethindrone-ethinyl estradiol-iron (LOESTRIN FE 1.5/30) 1.5-30 MG-MCG tablet Take 1 tablet by mouth daily.   [DISCONTINUED] UNABLE TO FIND Take 25 mg by mouth daily. Vegan Iron (Patient not taking: Reported on 02/15/2024)   No facility-administered encounter medications on file as of 02/15/2024.    History reviewed. No pertinent past medical history.  Past Surgical History:  Procedure Laterality Date   CESAREAN SECTION     TUBAL LIGATION      Family History  Problem Relation Age of Onset   Breast cancer Mother 25   Cancer Maternal Grandmother 24       ovarian   Diabetes Maternal Grandmother 59    Social History   Socioeconomic History   Marital status: Married    Spouse name: Not on file   Number of children: Not on file   Years of education: Not on file   Highest education level: Not on file  Occupational History   Not on file  Tobacco Use   Smoking status: Never   Smokeless tobacco: Never  Vaping Use   Vaping status: Never Used  Substance and Sexual Activity   Alcohol use: No   Drug use: No   Sexual activity: Yes    Birth control/protection: Surgical  Other Topics Concern   Not on file  Social History Narrative   Not on file   Social Drivers of Health   Financial Resource Strain: Not on file  Food Insecurity: No Food Insecurity (02/15/2024)   Hunger Vital Sign    Worried About Running Out of Food in the Last Year: Never true    Ran Out of Food in the Last Year: Never true  Transportation Needs: No Transportation Needs (02/15/2024)   PRAPARE  - Administrator, Civil Service (Medical): No    Lack of Transportation (Non-Medical): No  Physical Activity: Not on file  Stress: Not on file  Social Connections: Not on file  Intimate Partner Violence: Not At Risk (02/15/2024)   Humiliation, Afraid, Rape, and Kick questionnaire    Fear of Current or Ex-Partner: No    Emotionally Abused: No    Physically Abused: No    Sexually Abused: No    ROS  Per HPI      Objective    BP 137/84   Pulse 96   Temp 98.2 F (36.8 C) (Oral)   Ht 5' 4 (1.626 m)   Wt 237 lb (107.5 kg)   LMP 02/07/2024   SpO2 98%   BMI 40.68 kg/m   Physical Exam Constitutional:      General: She is not in acute distress.    Appearance: Normal appearance.  Eyes:     Pupils: Pupils are equal, round, and reactive to light.  Cardiovascular:     Rate and Rhythm: Normal rate and regular rhythm.     Heart sounds: Normal heart sounds. No murmur heard.    No friction rub. No gallop.  Pulmonary:     Effort: Pulmonary effort is normal. No respiratory distress.     Breath sounds: Normal breath sounds.  Abdominal:     General: Bowel sounds are normal.  Musculoskeletal:        General: No swelling.     Cervical back: Neck supple.  Lymphadenopathy:     Cervical: No cervical adenopathy.  Skin:    General: Skin is warm and dry.  Neurological:     General: No focal deficit present.     Mental Status: She is alert.  Psychiatric:        Mood and Affect: Mood normal.        Behavior: Behavior normal.        Thought Content: Thought content normal.          Assessment & Plan:   Migraine without aura and without status migrainosus, not intractable Assessment & Plan: Stable and well-controlled on Nurtec ODT every other day. Refill sent to [facility].   Adjustment disorder with mixed anxiety >er depressed mood -     Escitalopram  Oxalate; Take 1 tablet (10 mg total) by mouth daily.  Dispense: 90 tablet; Refill: 2  GAD (generalized anxiety  disorder) Assessment & Plan: Mood instability and insomnia due to Lexapro  discontinuation. Wellbutrin  increased from 150 mg to 300 mg today for symptom relief. Lexapro  reintroduced to prevent further withdrawal. - Increased Wellbutrin  to 300 mg daily. - Reintroduced Lexapro  at 5 mg daily for 7 days, then increase to 10 mg daily. - Sent prescriptions for Wellbutrin  and Lexapro  to [facility]. - Patient will reach out via MyChart if she feels any further adjustments needs to be made to medications.   Orders: -     Escitalopram  Oxalate; Take 1 tablet (10 mg total) by mouth daily.  Dispense: 90 tablet; Refill: 2  Encounter for prescription of oral contraceptives -     Norethin  Ace-Eth Estrad-FE; Take 1 tablet by mouth daily.  Dispense: 28 tablet; Refill: 11  Other iron deficiency anemia Assessment & Plan: Last CBC WNL at prev PCP office. Continue daily iron supp. Will recheck CBC and iron panel at CPE.   Environmental and seasonal allergies Assessment & Plan: Stable. Cont claritin daily prn   BMI 40.0-44.9, adult [facility]) Assessment & Plan: Lost 85 pounds with Zepbound  in clinical trial. Insurance may cover GLP-1 therapy in January. Discussed potential use of Zepbound  or Wegovy . - Will initiate GLP-1 therapy in January, pending insurance approval. - Will contact office in January to confirm insurance coverage and initiate therapy. - In the meantime, continue with well balanced calorie-deficit diet and increased physical activity to maintain current weight/aid in weight loss.    Dysmenorrhea Assessment & Plan: Using LoestrinFe for menstrual regulation. Lighter periods suggest perimenopausal changes. Discussed hormone replacement therapy if symptoms worsen. - Continue Loestrin Fe for menstrual regulation. - Monitor menstrual cycle changes and symptoms.   Other orders -     buPROPion  HCl ER (XL); Take 1 tablet (300 mg total) by mouth daily.  Dispense: 30 tablet; Refill: 3 -      Nurtec; Take 1 tablet (75 mg total) by mouth every other day.  Dispense: 16 tablet; Refill: 0     Return  in about 6 months (around 08/14/2024) for Physical.   Saddie JULIANNA Sacks, PA-C

## 2024-02-15 NOTE — Assessment & Plan Note (Signed)
 Stable and well-controlled on Nurtec ODT every other day. Refill sent to [facility].

## 2024-02-15 NOTE — Assessment & Plan Note (Signed)
 Mood instability and insomnia due to Lexapro  discontinuation. Wellbutrin  increased from 150 mg to 300 mg today for symptom relief. Lexapro  reintroduced to prevent further withdrawal. - Increased Wellbutrin  to 300 mg daily. - Reintroduced Lexapro  at 5 mg daily for 7 days, then increase to 10 mg daily. - Sent prescriptions for Wellbutrin  and Lexapro  to [facility]. - Patient will reach out via MyChart if she feels any further adjustments needs to be made to medications.

## 2024-02-15 NOTE — Assessment & Plan Note (Signed)
 Last CBC WNL at prev PCP office. Continue daily iron supp. Will recheck CBC and iron panel at CPE.

## 2024-02-15 NOTE — Patient Instructions (Addendum)
 VISIT SUMMARY: Today, we addressed your mood disturbances, sleep difficulties, menstrual regulation, migraines, allergies, iron deficiency, and discussed future weight management plans. We adjusted your medications and provided guidance on managing your conditions.  YOUR PLAN: MOOD DISTURBANCES AND SLEEP DIFFICULTY: You have been experiencing significant mood disturbances and difficulty sleeping since stopping Lexapro . -Increase Wellbutrin  to 300 mg daily. -Reintroduce Lexapro  at 5 mg daily for 7 days, then increase to 10 mg daily. -Prescriptions for Wellbutrin  and Lexapro  have been sent to [facility].   MENSTRUAL REGULATION: You are using Loestrin Fe for menstrual regulation and are satisfied with its effectiveness. -Continue taking Nortrel for menstrual regulation. -Monitor any changes in your menstrual cycle and symptoms.  MIGRAINE: You experience 1-2 migraines per month, and Nurtec has been effective for both prevention and acute relief. -Continue taking Nurtec ODT every other day for maintenance and as needed for migraines. -Prescription for [facility]tec has [facility] to [facility].  ALLERGIC RHINITIS: You have seasonal allergies and are managing them with Claritin. -Continue taking over-the-counter Claritin as needed.  IRON DEFICIENCY: You are taking a daily over-the-counter iron supplement without any issues. -Continue taking your daily over-the-counter iron supplement.  OBESITY (PLANNED GLP-1 THERAPY): You have lost 85 pounds with Zepbound  in a clinical trial, and we discussed starting GLP-1 therapy in January, pending insurance approval. -Plan to initiate GLP-1 therapy in January, pending insurance approval. -Contact the office in January to confirm insurance coverage and start therapy.  GENERAL HEALTH MAINTENANCE: We reviewed your general health maintenance needs. -We will obtain records of your last Pap smear from your previous primary care provider. -We will schedule your next  mammogram for next year. -We will discuss colonoscopy screening when you turn 43.  If you have any problems before your next visit feel free to message me via MyChart (minor issues or questions) or call the office, otherwise you may reach out to schedule an office visit.  Thank you! Saddie Sacks, PA-C

## 2024-02-15 NOTE — Assessment & Plan Note (Signed)
 Using LoestrinFe for menstrual regulation. Lighter periods suggest perimenopausal changes. Discussed hormone replacement therapy if symptoms worsen. - Continue Loestrin Fe for menstrual regulation. - Monitor menstrual cycle changes and symptoms.

## 2024-02-18 ENCOUNTER — Other Ambulatory Visit (HOSPITAL_COMMUNITY): Payer: Self-pay

## 2024-03-13 ENCOUNTER — Ambulatory Visit

## 2024-03-27 DIAGNOSIS — Z6841 Body Mass Index (BMI) 40.0 and over, adult: Secondary | ICD-10-CM

## 2024-03-28 MED ORDER — WEGOVY 0.25 MG/0.5ML ~~LOC~~ SOAJ: 0.2500 mg | SUBCUTANEOUS | 2 refills | Status: AC

## 2024-04-13 ENCOUNTER — Other Ambulatory Visit: Payer: Self-pay

## 2024-04-13 DIAGNOSIS — G43009 Migraine without aura, not intractable, without status migrainosus: Secondary | ICD-10-CM

## 2024-04-13 MED ORDER — NURTEC 75 MG PO TBDP: 75.0000 mg | ORAL_TABLET | ORAL | 0 refills | Status: AC

## 2024-08-17 ENCOUNTER — Other Ambulatory Visit

## 2024-08-24 ENCOUNTER — Encounter
